# Patient Record
Sex: Female | Born: 1965 | Race: Black or African American | Hispanic: No | Marital: Married | Smoking: Never smoker
Health system: Southern US, Community
[De-identification: ages and names within clinical notes are randomized; demographics above are authoritative.]

## PROBLEM LIST (undated history)

## (undated) DIAGNOSIS — R609 Edema, unspecified: Secondary | ICD-10-CM

## (undated) DIAGNOSIS — I10 Essential (primary) hypertension: Secondary | ICD-10-CM

## (undated) DIAGNOSIS — G4730 Sleep apnea, unspecified: Secondary | ICD-10-CM

## (undated) DIAGNOSIS — S82831A Other fracture of upper and lower end of right fibula, initial encounter for closed fracture: Secondary | ICD-10-CM

## (undated) HISTORY — PX: DILATION AND CURETTAGE OF UTERUS: SHX78

---

## 2000-07-30 ENCOUNTER — Emergency Department (HOSPITAL_COMMUNITY): Admission: EM | Admit: 2000-07-30 | Discharge: 2000-07-30 | Payer: Self-pay | Admitting: Emergency Medicine

## 2001-06-28 ENCOUNTER — Ambulatory Visit (HOSPITAL_COMMUNITY): Admission: RE | Admit: 2001-06-28 | Discharge: 2001-06-28 | Payer: Self-pay | Admitting: Family Medicine

## 2001-06-28 ENCOUNTER — Encounter: Payer: Self-pay | Admitting: Family Medicine

## 2002-03-22 ENCOUNTER — Inpatient Hospital Stay (HOSPITAL_COMMUNITY): Admission: RE | Admit: 2002-03-22 | Discharge: 2002-03-24 | Payer: Self-pay | Admitting: Obstetrics and Gynecology

## 2002-03-22 ENCOUNTER — Encounter (INDEPENDENT_AMBULATORY_CARE_PROVIDER_SITE_OTHER): Payer: Self-pay | Admitting: Specialist

## 2002-08-11 HISTORY — PX: ABDOMINAL HYSTERECTOMY: SHX81

## 2002-09-22 ENCOUNTER — Encounter: Payer: Self-pay | Admitting: Obstetrics and Gynecology

## 2002-09-22 ENCOUNTER — Ambulatory Visit (HOSPITAL_COMMUNITY): Admission: RE | Admit: 2002-09-22 | Discharge: 2002-09-22 | Payer: Self-pay | Admitting: Obstetrics and Gynecology

## 2002-11-07 ENCOUNTER — Encounter (INDEPENDENT_AMBULATORY_CARE_PROVIDER_SITE_OTHER): Payer: Self-pay

## 2002-11-07 ENCOUNTER — Inpatient Hospital Stay (HOSPITAL_COMMUNITY): Admission: RE | Admit: 2002-11-07 | Discharge: 2002-11-09 | Payer: Self-pay | Admitting: Obstetrics and Gynecology

## 2003-02-15 ENCOUNTER — Encounter: Payer: Self-pay | Admitting: Internal Medicine

## 2003-02-15 ENCOUNTER — Encounter: Admission: RE | Admit: 2003-02-15 | Discharge: 2003-02-15 | Payer: Self-pay | Admitting: Internal Medicine

## 2003-10-06 ENCOUNTER — Ambulatory Visit (HOSPITAL_COMMUNITY): Admission: RE | Admit: 2003-10-06 | Discharge: 2003-10-06 | Payer: Self-pay | Admitting: Family Medicine

## 2004-02-16 ENCOUNTER — Encounter: Admission: RE | Admit: 2004-02-16 | Discharge: 2004-02-16 | Payer: Self-pay | Admitting: Internal Medicine

## 2004-08-11 DIAGNOSIS — G4730 Sleep apnea, unspecified: Secondary | ICD-10-CM

## 2004-08-11 HISTORY — DX: Sleep apnea, unspecified: G47.30

## 2004-10-27 ENCOUNTER — Emergency Department (HOSPITAL_COMMUNITY): Admission: EM | Admit: 2004-10-27 | Discharge: 2004-10-27 | Payer: Self-pay | Admitting: Emergency Medicine

## 2004-10-28 ENCOUNTER — Encounter: Admission: RE | Admit: 2004-10-28 | Discharge: 2004-10-28 | Payer: Self-pay | Admitting: Internal Medicine

## 2004-11-04 ENCOUNTER — Emergency Department (HOSPITAL_COMMUNITY): Admission: EM | Admit: 2004-11-04 | Discharge: 2004-11-04 | Payer: Self-pay | Admitting: Emergency Medicine

## 2005-06-21 ENCOUNTER — Ambulatory Visit (HOSPITAL_BASED_OUTPATIENT_CLINIC_OR_DEPARTMENT_OTHER): Admission: RE | Admit: 2005-06-21 | Discharge: 2005-06-21 | Payer: Self-pay | Admitting: Family Medicine

## 2005-06-29 ENCOUNTER — Ambulatory Visit: Payer: Self-pay | Admitting: Internal Medicine

## 2005-10-23 ENCOUNTER — Ambulatory Visit: Payer: Self-pay | Admitting: Gastroenterology

## 2005-10-24 ENCOUNTER — Emergency Department (HOSPITAL_COMMUNITY): Admission: EM | Admit: 2005-10-24 | Discharge: 2005-10-24 | Payer: Self-pay | Admitting: Emergency Medicine

## 2005-11-12 ENCOUNTER — Ambulatory Visit: Payer: Self-pay | Admitting: Gastroenterology

## 2005-11-12 ENCOUNTER — Encounter (INDEPENDENT_AMBULATORY_CARE_PROVIDER_SITE_OTHER): Payer: Self-pay | Admitting: *Deleted

## 2006-04-01 ENCOUNTER — Encounter: Admission: RE | Admit: 2006-04-01 | Discharge: 2006-06-30 | Payer: Self-pay | Admitting: Chiropractic Medicine

## 2006-04-01 ENCOUNTER — Encounter: Admission: RE | Admit: 2006-04-01 | Discharge: 2006-04-01 | Payer: Self-pay | Admitting: Internal Medicine

## 2006-08-11 HISTORY — PX: DIAGNOSTIC LAPAROSCOPY: SUR761

## 2007-01-22 ENCOUNTER — Encounter (INDEPENDENT_AMBULATORY_CARE_PROVIDER_SITE_OTHER): Payer: Self-pay | Admitting: Obstetrics and Gynecology

## 2007-01-22 ENCOUNTER — Ambulatory Visit (HOSPITAL_COMMUNITY): Admission: RE | Admit: 2007-01-22 | Discharge: 2007-01-22 | Payer: Self-pay | Admitting: Obstetrics and Gynecology

## 2007-04-21 ENCOUNTER — Encounter: Admission: RE | Admit: 2007-04-21 | Discharge: 2007-04-21 | Payer: Self-pay | Admitting: Internal Medicine

## 2007-09-26 ENCOUNTER — Emergency Department (HOSPITAL_COMMUNITY): Admission: EM | Admit: 2007-09-26 | Discharge: 2007-09-26 | Payer: Self-pay | Admitting: Emergency Medicine

## 2009-04-01 ENCOUNTER — Emergency Department (HOSPITAL_COMMUNITY): Admission: EM | Admit: 2009-04-01 | Discharge: 2009-04-02 | Payer: Self-pay | Admitting: Emergency Medicine

## 2010-03-06 ENCOUNTER — Encounter: Admission: RE | Admit: 2010-03-06 | Discharge: 2010-03-06 | Payer: Self-pay | Admitting: Internal Medicine

## 2010-03-21 ENCOUNTER — Encounter: Admission: RE | Admit: 2010-03-21 | Discharge: 2010-03-21 | Payer: Self-pay | Admitting: Internal Medicine

## 2010-09-01 ENCOUNTER — Encounter: Payer: Self-pay | Admitting: Internal Medicine

## 2010-11-16 LAB — GLUCOSE, CAPILLARY: Glucose-Capillary: 85 mg/dL (ref 70–99)

## 2010-12-24 NOTE — Op Note (Signed)
Cynthia Hendricks, Cynthia Hendricks                ACCOUNT NO.:  [id number]   MEDICAL RECORD NO.:  [id number]          PATIENT TYPE:  AMB   LOCATION:  SDC                           FACILITY:  [facility]   PHYSICIAN:  Zenaida Niece, M.D.DATE OF BIRTH:  July 24, 1966   DATE OF PROCEDURE:  01/22/2007  DATE OF DISCHARGE:                               OPERATIVE REPORT   PREOPERATIVE DIAGNOSIS:  Pelvic pain.   POSTOPERATIVE DIAGNOSIS:  Pelvic pain and pelvic adhesions.   PROCEDURE:  Open laparoscopy with bilateral oophorectomy and  adhesiolysis.   SURGEON:  Zenaida Niece, M.D.   ANESTHESIA:  General endotracheal tube.   SPECIMENS:  Bilateral ovaries to pathology.   ESTIMATED BLOOD LOSS:  Minimal.   COMPLICATIONS:  None.   FINDINGS:  She had a normal upper abdomen. She had omentum adherent to  the anterior abdominal wall in the midline. Both ovaries were stuck to  the pelvic side walls and the vaginal cuff with fairly dense adhesions.  Neither fallopian tube was definitely identified.   PROCEDURE IN DETAIL:  The patient was taken to the operating room and  placed in the dorsal supine position. General anesthesia was induced,  and she was placed in mobile stirrups. Infraumbilical skin was  infiltrated with 0.25% Marcaine, and a 3-cm horizontal incision was  made. The fascia was identified bluntly and elevated with Kocher clamps.  It was then entered sharply. The peritoneum was then identified and  entered bluntly. A pursestring suture of 0 Vicryl was placed around the  fascial incision and a Hasson cannula inserted. The abdomen was then  insufflated with CO2 gas. Upon entering with the laparoscope, omental  adhesions in the midline were found. A 5-mm port was placed on the left  side, and using harmonic scalpel, these omental adhesions were taken  down with good hemostasis and without complications. I was then able to  visualize the pelvis. Both ovaries were identified and were found to be  significantly adherent to the pelvic side walls and the vaginal cuff.  They appeared to be adherent to each other at the vaginal cuff. Another  5-mm port was placed low in the midline also under direct visualization.  This was used to grasp the ovary and elevate it. Using the harmonic  scalpel, I carefully freed up some of the adhesions of the right ovary  to make it a little bit more mobile. The right infundibulopelvic  ligament was then taken down with the harmonic scalpel, and this was  hemostatic. Staying laterally and careful to avoid the ureter which was  easily identified, I was able to gradually free the ovary from the side  wall with significant dissection using the harmonic scalpel. However, it  was difficult to tell where the ovary ended and the side wall and  vaginal cuff started. I was then able to free up some adhesions that  were covering the left ovary which was also stuck at the vaginal cuff.  The left ovary was grasped and elevated, and again using harmonic  scalpel, I was able to take down some adhesions to expose the  ovary and  infundibulopelvic ligament. The infundibulopelvic ligament was taken  down with the harmonic scalpel, and then staying lateral, I was able to  free up the left ovary from the pelvic side wall. I was eventually able  to free up where both of the ovaries were just attached to the vaginal  cuff, and this was then taken down with the harmonic scalpel to free  both ovaries which were adherent to each other. These were held onto,  and the pelvis was copiously irrigated and a small amount of bleeding  controlled with the harmonic scalpel. I was never able to definitively  identify fallopian tubes. The left ureter was not able to be identified  due to adhesions, but all suture lines appeared fairly superficial. I  was still able to visualize the right ureter, and it was still  peristalsing. A 5-mm scope was then inserted, and an Endobag was placed   through the umbilical trocar. The ovaries were placed in the bag, and  these were brought to the umbilical trocar. The pelvis was again  inspected and irrigated and found to be hemostatic. The 5-mm trocar low  in the midline was removed and found to be hemostatic. The lateral 5-mm  port was also removed. The ovaries were then easily removed in the bag  through the umbilical incision. All gas was allowed to deflate from the  abdomen. The previously placed pursestring suture was tied. There was  still a small fascial defect in the midline, and this was repaired with  a figure-of-eight suture of 0 Vicryl. Skin incisions were then closed  with interrupted subcuticular sutures of 4-0 Vicryl followed by  Dermabond. A sponge stick which had been placed in the vagina for  visualization of the vaginal cuff was removed. The patient tolerated the  procedure well. She was extubated in the operating room and taken to the  recovery room in stable condition. Counts were correct, and she had PAS  hose on throughout the procedure.      Zenaida Niece, M.D.  Electronically Signed     TDM/MEDQ  D:  01/22/2007  T:  01/22/2007  Job:  [id number]

## 2010-12-27 NOTE — Discharge Summary (Signed)
   NAME:  Cynthia Hendricks, Cynthia Hendricks                          ACCOUNT NO.:  [id number]   MEDICAL RECORD NO.:  [id number]                   PATIENT TYPE:  INP   LOCATION:  0460                                 FACILITY:  [facility]   PHYSICIAN:  Zenaida Niece, M.D.             DATE OF BIRTH:  September 12, 1965   DATE OF ADMISSION:  11/07/2002  DATE OF DISCHARGE:  11/09/2002                                 DISCHARGE SUMMARY   ADMISSION DIAGNOSIS:  Symptomatic leiomyomatous uterus.   DISCHARGE DIAGNOSES:  Symptomatic leiomyomatous uterus and left ovarian  cyst.   PROCEDURES:  Total abdominal hysterectomy and left ovarian cystectomy.   HISTORY AND PHYSICAL:  Please see the chart for the full history and  physical. Briefly, this is a 45 year old black female, gravida 2, para 1-0-1-  1 who underwent an abdominal myomectomy in August of 2003.  She initially  went well but now has had persistent abnormal bleeding despite oral  contraceptives and wishes to proceed with definitive surgical therapy.  Ultrasound does reveal two small fibroids. Physical exam significant for a  benign abdomen with a well healed transverse scar. On pelvic exam, she  appears to have a normal size mid planar nontender uterus without adnexal  masses.   HOSPITAL COURSE:  The patient was admitted on the day of surgery and  underwent a TAH and left ovarian cystectomy. There were adhesions of the  ovaries to the uterus and sidewalls and there was a 6 cm simple left ovarian  cyst. Blood loss was 200 mL. Postoperatively she did very well, remained  afebrile and was rapidly able to ambulate and tolerate a diet. She did  experience some nausea on postoperative day #1 but was able to tolerate a  regular diet on the evening of postoperative day one. Preoperative  hemoglobin 12.4, postoperative 11.4. On the morning of postoperative day  two, she was felt to be stable enough for discharge home.   DISCHARGE INSTRUCTIONS:  Regular diet, pelvic  rest, no strenuous activity.  Followup is in two days for staple removal.   DISCHARGE MEDICATIONS:  Percocet #30, 1-2 p.o. q. 4-6h p.r.n. pain.   Final pathology is pending.                                               Zenaida Niece, M.D.    TDM/MEDQ  D:  11/09/2002  T:  11/09/2002  Job:  [id number]

## 2010-12-27 NOTE — Op Note (Signed)
NAME:  Cynthia Hendricks, Cynthia Hendricks                          ACCOUNT NO.:  [id number]   MEDICAL RECORD NO.:  [id number]                   PATIENT TYPE:  INP   LOCATION:  G956                                 FACILITY:  [facility]   PHYSICIAN:  Zenaida Niece, M.D.             DATE OF BIRTH:  1966/08/02   DATE OF PROCEDURE:  11/07/2002  DATE OF DISCHARGE:                                 OPERATIVE REPORT   PREOPERATIVE DIAGNOSIS:  Symptomatic leiomyomatous uterus.   POSTOPERATIVE DIAGNOSES:  1. Symptomatic leiomyomatous uterus.  2. Simple left ovarian cyst.   PROCEDURE:  Total abdominal hysterectomy with left ovarian cystectomy.   SURGEON:  Zenaida Niece, M.D.   ASSISTANT:  Malachi Pro. Ambrose Mantle, M.D.   ANESTHESIA:  General endotracheal tube.   ESTIMATED BLOOD LOSS:  200 mL.   FINDINGS:  Uterus was slightly enlarged, and there were adhesions in the  anterior cul-de-sac and both ovaries.  There was a 6 cm simple left ovarian  cyst.  Appendix was normal.   PROCEDURE IN DETAIL:  The patient was taken to the operating room and placed  in the dorsal supine position.  General anesthesia was induced, and her  abdomen was prepped and draped in the usual sterile fashion.  Foley catheter  was also inserted, and the vagina was prepped with Betadine.  Her abdomen  was then entered via her previous Pfannenstiel incision.  There was a fair  amount of scar tissue at the level of the fascia, making the fascia  difficult to identify.  Peritoneal cavity was entered bluntly and the  incision extended superiorly and inferiorly with good visualization of the  bowl and bladder.  A self-retaining retractor was then placed, and the  bowels were packed out of the pelvis.  Adhesions of the anterior cul-de-sac  to the anterior portion of the uterus were taken down bluntly, and the  uterine cornua were grasped with Kelly clamps.  Both round ligaments were  divided with electrocautery, and a window was made in the  posterior leaf of  the broad ligament.  This was done after both ovaries were mobilized from  the neck of the uterus and the sidewalls to free up filmy adhesions.  The  uteroovarian pedicles on each side were clamped with Zepplin clamps, and  these pedicles were transected and doubly ligated with #1 chromic.  The  bladder flap was created across the anterior portion of the uterus and the  bladder pushed inferiorly.  The uterine arteries were skeletonized, clamped,  transected, and ligated with #1 chromic.  Cardinal ligaments and uterosacral  ligaments were clamped, transected, and ligated on each side with #1  chromic.  On the patient's left side, this also included the left vaginal  angle.  Right vaginal angle was then clamped, transected, and ligated with  #1 chromic, and the remainder of the cervix was removed from the vagina.  The vaginal cuff  was then closed with interrupted figure-of-eight sutures of  #1 chromic with adequate hemostasis.  Uterosacral ligaments were plicated in  the midline with one interrupted suture of #1 chromic.  Both ureters had  previously been identified and found to be out of the operative field.  All  pedicles were inspected and the bladder flap also inspected and found to be  hemostatic.  There was then noted to be some bleeding from the right  uteroovarian pedicle, and this was sutured with 3-0 and 0 Vicryl to obtain  hemostasis.  Attention was then turned to the left ovary.  A window was made  over the simple left ovarian cyst, and the cyst was drained.  The cyst wall  was then removed with sharp and blunt dissection from the remainder of the  ovarian tissue and sent as a specimen.  The redundant ovarian tissue was  resected with electrocautery and the remainder of the ovary made hemostatic.  The ovarian capsule was not closed as it was hemostatic.  The pelvis was  again irrigated and found to be hemostatic.  All packs were removed from the  abdomen.  The  subfascial space was irrigated and then made hemostatic with  electrocautery.  The rectus muscle was closed in the midline with one figure-  of-eight suture of #1 Vicryl just to keep the bowels from prolapsing through  this incision.  Fascia was then closed in a running fashion, starting at  both ends and meeting in the middle with #1 PDS.  Again, fascial layers were  slightly difficult to identify from her previous surgery.  Subcutaneous  tissue was irrigated and made hemostatic with electrocautery, and the skin  was closed with staples and a sterile dressing.  The patient tolerated the  procedure well, was extubated in the operating room and taken to the  recovery room in stable condition.  Counts were correct x 2, and she was  given Ancef 1 g prior to the procedure.                                               Zenaida Niece, M.D.    TDM/MEDQ  D:  11/07/2002  T:  11/07/2002  Job:  [id number]

## 2010-12-27 NOTE — Discharge Summary (Signed)
Cynthia, Hendricks                          ACCOUNT NO.:  [id number]   MEDICAL RECORD NO.:  [id number]                   PATIENT TYPE:  INP   LOCATION:  0465                                 FACILITY:  [facility]   PHYSICIAN:  Zenaida Niece, M.D.             DATE OF BIRTH:  1966-04-07   DATE OF ADMISSION:  03/22/2002  DATE OF DISCHARGE:  03/24/2002                                 DISCHARGE SUMMARY   ADMISSION DIAGNOSIS:  Symptomatic leiomyomatous uterus.   DISCHARGE DIAGNOSIS:  Symptomatic leiomyomatous uterus.   PROCEDURE:  Abdominal myomectomy.   COMPLICATIONS:  None.   CONSULTATIONS:  None.   HISTORY OF PRESENT ILLNESS:  Please see the chart for the full history and  physical, but briefly, this is a 45 year old black female, gravida 2, para 1-  0-1-1, with dyspareunia, irregular bleeding, some difficulty urinating due  to pressure on her bladder.  These symptoms are all due to an enlarged  leiomyomatous uterus.  Options have been discussed with the patient and she  wishes to proceed with myomectomy.  Again, her past history is in the chart.   PHYSICAL EXAMINATION:  GENERAL:  Significant for a weight of 269 pounds.  ABDOMEN:  Soft, nondistended, slightly tender on the right lower quadrant  with a palpable fibroid at that point.  PELVIC:  An anteverted irregular uterus approximately 12 weeks in size with  no significant adnexal masses and palpable fibroids.   HOSPITAL COURSE:  The patient was admitted on 03/22/02, and underwent an  abdominal myomectomy under general anesthesia.  The uterus was 10 to 12  weeks size with two large anterior myomas and several smaller posterior  myomas.  She had normal tubes and ovaries with a 2 to 3 cm simple left  ovarian cyst.  Postoperatively, she did very well.  She remained afebrile  and was fairly rapidly able to ambulate and tolerate a regular diet.  Preoperative hemoglobin was 13.7, postoperative was 11.2.  On the night of  postoperative day #1, she did have a low-grade temperature to 100.5, but  other than that remained afebrile.  On the morning of postoperative day #2,  her incision was healing well.  Later that day she had gone without any  further significant fevers, and was felt to be stable enough for discharge  home.   DIET:  Regular.   ACTIVITY:  Pelvic rest, no strenuous activity.    FOLLOWUP:  In three to four days for staple removal.   DISCHARGE MEDICATIONS:  Percocet p.r.n. pain.                                                Zenaida Niece, M.D.    TDM/MEDQ  D:  04/09/2002  T:  04/09/2002  Job:  [id number]

## 2010-12-27 NOTE — Procedures (Signed)
Cynthia Hendricks, Cynthia Hendricks                ACCOUNT NO.:  [id number]   MEDICAL RECORD NO.:  [id number]          PATIENT TYPE:  OUT   LOCATION:  SLEEP CENTER                 FACILITY:  [facility]   PHYSICIAN:  Clinton D. Maple Hudson, M.D. DATE OF BIRTH:  01-09-1966   DATE OF STUDY:  06/21/2005                              NOCTURNAL POLYSOMNOGRAM   REFERRING PHYSICIAN:  Dr. Ralene Ok.   DATE OF STUDY:  June 21, 2005.   INDICATION FOR STUDY:  Hypersomnia with sleep apnea.   EPWORTH SLEEPINESS SCORE:  17/24.   BMI:  34.8.   WEIGHT:  225 pounds.   SLEEP ARCHITECTURE:  Total sleep time 404 minutes with sleep efficiency 92%.  Stage I 12%, stage II 56%, stages III and IV 2%, REM 29% of total sleep  time. Sleep latency 11 minutes, REM latency 60 minutes, awake after sleep  onset 25 minutes. Arousal index modestly increased at 26. There were  frequent brief awakenings and sleep stage changes in the first half of the  night. No bedtime medication taken.   RESPIRATORY DATA:  Apnea/hypopnea index (AHI, RDI) 19.3 obstructive events  per hour indicating moderate obstructive sleep apnea/hypopnea syndrome. This  included 75 obstructive apneas, 1 mixed apnea and 54 hypopneas. Most initial  sleep and most events were while supine although significant events were  also reported while sleeping on her right side later in the night. REM AHI  45.4. She did not have enough early events to permit use of C-PAP titration  by split protocol on the study night.   OXYGEN DATA:  Moderate snoring with oxygen desaturation to a nadir of 86%.  Mean oxygen saturation through the study was 97% on room air.   CARDIAC DATA:  Normal sinus rhythm with occasional PVCs and PJC.   MOVEMENT/PARASOMNIA:  Leg jerks noticed mostly during arousals, nonspecific.   IMPRESSION/RECOMMENDATIONS:  1.  Moderate obstructive sleep apnea/hypopnea syndrome, AHI 19.3 per hour,      increased while supine and in REM.  2.  Moderate snoring with  oxygen desaturation to 86%. Normal mean oxygen      saturation across the study night.  3.  Consider return for C-PAP titration or evaluate for alternative      therapies as appropriate.      Clinton D. Maple Hudson, M.D.  Diplomate, Biomedical engineer of Sleep Medicine  Electronically Signed     CDY/MEDQ  D:  06/29/2005 09:36:08  T:  06/29/2005 22:29:39  Job:  [id number]

## 2010-12-27 NOTE — H&P (Signed)
NAME:  Cynthia Hendricks, Cynthia Hendricks                          ACCOUNT NO.:  [id number]   MEDICAL RECORD NO.:  [id number]                   PATIENT TYPE:  INP   LOCATION:  NA                                   [facility]ACILITY[facility]   PHYSICIAN:  Zenaida Niece, M.D.             DATE OF BIRTH:  05/04/1966   DATE OF ADMISSION:  11/07/2002  DATE OF DISCHARGE:                                HISTORY & PHYSICAL   CHIEF COMPLAINT:  Symptomatic leiomyomatous uterus.   HISTORY OF PRESENT ILLNESS:  This is a 45 year old black female, gravida 2,  para 1-0-1-1 who had an abdominal myomectomy in August of 2003. At that  time, the uterus was 10-12 week size with two large anterior myomas and  several small posterior myomas. These were all removed and at the end of the  procedure there were no palpable fibroids. The patient initially did well  after surgery and was placed on oral contraceptives for birth control. She  then had some heavy bleeding with the birth control pills and some irregular  bleeding. Pelvic ultrasound performed February 12 revealed at least two  measurable fibroids measuring 1.2 x 1.3 cm and 2. 3 x 2.3 cm. These fibroids  were not felt to be submucosal. The patient was counselled as to options and  initially wished to undergo endometrial oblation. However on further  thought, she wishes to proceed with definitive surgical therapy and is  admitted for hysterectomy at this time.   PAST OB HISTORY:  1992 vaginal delivery at term without complications, baby  weighed 8 pounds. She has had one elective termination.   PAST MEDICAL HISTORY:  Gastroesophageal reflux, sinusitis, heel spurs.   PAST SURGICAL HISTORY:  Double myomectomy.   ALLERGIES:  None known.   CURRENT MEDICATIONS:  None.   FAMILY HISTORY:  No GYN or colon cancer.   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   PHYSICAL EXAMINATION:   VITAL SIGNS:  Weight is approximately 250 pounds.  GENERAL:  She is a slightly obese black female in no acute distress.  NECK:  Supple without lymphadenopathy or thyromegaly.  LUNGS:  Clear to auscultation.  HEART:  Regular rate and rhythm without murmur.  ABDOMEN:  Soft, nontender, nondistended without any palpable masses and does  have a well healed transverse scar.  EXTREMITIES:  No edema, are nontender and DTRs are 2/4 and symmetric.  PELVIC:  Reveals normal external genitalia. On speculum exam, she has a  normal cervix. On bimanual exam, she has a fairly small mid planar nontender  uterus and no adnexal masses.   ASSESSMENT:  Symptomatic leiomyomatous uterus. This is approximately 7  months status post abdominal myomectomy. An extensive discussion has been  had with the patient regarding all possible options.  This includes all  medical and surgical  options. The patient wishes now to proceed with  definitive surgical therapy. Permanent sterility has been discussed with the  patient. The risks of surgery including bleeding, infection and damage to  surrounding organs have also been discussed. Preop testing has revealed a  UTI with E. coli and the patient is currently on Septra.   PLAN:  Admit the patient on March 29 for an abdominal hysterectomy with  possible BSO. We will retain her ovaries unless they are abnormal or  encounter significant bleeding.                                               Zenaida Niece, M.D.    TDM/MEDQ  D:  11/04/2002  T:  11/04/2002  Job:  [id number]

## 2010-12-27 NOTE — H&P (Signed)
NAME:  Cynthia Hendricks, Cynthia Hendricks                          ACCOUNT NO.:  [id number]   MEDICAL RECORD NO.:  [id number]                   PATIENT TYPE:  INP   LOCATION:  NA                                   FACILITY:  [facility]   PHYSICIAN:  Zenaida Niece, M.D.             DATE OF BIRTH:  05-11-1966   DATE OF ADMISSION:  03/22/2002  DATE OF DISCHARGE:                                HISTORY & PHYSICAL   CHIEF COMPLAINT:  Symptomatic leiomyomatous uterus.   HISTORY OF PRESENT ILLNESS:  The patient is a 45 year-old black female,  gravida 2, para 1-0-1-1 who was seen on July 17.  At that time she said her  last normal menses was in April 2003 and since then she has had light  bleeding one to two times a month without cramping.  She is occasionally  sexually active with deep dyspareunia and does have some difficulty  urinating due to pressure on her bladder. Exam was consistent with an  enlarged fibroid uterus.  Pelvic ultrasound from November 2002 reveals at  least two large fundal myomas measuring almost 5 cm and almost 4 cm.  An  endometrial biopsy done in the office reveals focal simple hyperplasia  without atypia.  The patient wishes to preserve her fertility.  All options  had been discussed with the patient and she wishes to proceed with surgical  myomectomy.   OB HISTORY:  1. 1992, vaginal delivery at term without complications.  The baby weighed 8     pounds.  2. One elective termination.   PAST MEDICAL HISTORY:  1. Recent gastroesophageal reflux.  2. Recent sinusitis.  3. Heel spurs.   PAST SURGICAL HISTORY:  None.   ALLERGIES:  None known.   MEDICATIONS:  1. Codeine for heel spurs.  2. Previously on Nordette oral contraceptives.  3. She is just finishing a course of Augmentin for sinusitis.  4. Prevacid for reflux.   FAMILY HISTORY:  No GYN or colon cancer.   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  She denies  alcohol, tobacco or drug use.   PHYSICAL EXAMINATION:  VITAL SIGNS:  Weight is 269 pounds, pulse 70, blood  pressure 120/70.  GENERAL:  She is a slightly obese black female in no acute distress.  HEENT:  Neck is supple without lymphadenopathy or thyromegaly.  LUNGS:  Clear to auscultation.  CARDIAC:  Heart - regular rate and rhythm without murmur.  ABDOMEN:  Soft and non-distended, slightly tender in the right lower  quadrant with a palpable fibroid in the right lower quadrant.  PELVIC:  External genitalia reveals condylomata at 3:00.  On speculum exam  she has a normal cervix and the Pipelle sounded to 9 cm and did deviate to  the patient's right side.  Bimanual exam reveals an anteverted, irregular  uterus, approximately 12  weeks size which is deviated mostly to the  patient's right and is slightly tender with palpable fibroids.   OFFICE LABORATORIES:  Hemoglobin 12.8.  Negative urine dipstick.   ASSESSMENT:  Symptomatic leiomyomatous uterus.  Options have been discussed  with the patient including medical treatment with Lupron, uterine artery  embolization and surgical therapy with myomectomy or hysterectomy.  As the  patient does wish to retain the possibility of having children, I have  discouraged the use of uterine artery embolization and obviously  hysterectomy.  Weighing these options, she has elected to proceed with  myomectomy.  The risks of surgery including bleeding, infection and damage  to bowels and bladder as well as risks of transfusion and hysterectomy have  been discussed with the patient.   PLAN:  Admit the patient on the day for surgery for an abdominal myomectomy.                                               Zenaida Niece, M.D.    TDM/MEDQ  D:  03/15/2002  T:  03/15/2002  Job:  [id number]

## 2010-12-27 NOTE — Op Note (Signed)
TNAMENEALA, MIGGINS                         ACCOUNT NO.:  [id number]   MEDICAL RECORD NO.:  [id number]                   PATIENT TYPE:  INP   LOCATION:  NA                                   FACILITY:  [facility]   PHYSICIAN:  Zenaida Niece, M.D.             DATE OF BIRTH:  02/10/1966   DATE OF PROCEDURE:  03/22/2002  DATE OF DISCHARGE:                                 OPERATIVE REPORT   PREOPERATIVE DIAGNOSIS:  Symptomatic leiomyomatous uterus.   POSTOPERATIVE DIAGNOSIS:  Symptomatic leiomyomatous uterus.   PROCEDURE:  Abdomen myomectomy.   SURGEON:  Lavina Hamman, M.D.   ASSISTANT:  Tracey Harries, M.D.   ANESTHESIA:  General endotracheal tube.   ESTIMATED BLOOD LOSS:  100 cc.   FINDINGS:  The uterus was enlarged to 10-12 week size with two large  anterior myomas and several small posterior myomas. The tubes and ovaries  appeared normal except for a 2-3 cm simple left ovarian cyst.   DESCRIPTION OF PROCEDURE:  The patient was taken to the operating room and  placed in the dorsal supine position. General anesthesia was induced and her  abdomen was prepped and draped in the usual sterile fashion and a Foley  catheter inserted. Her abdomen was then entered via a standard Pfannenstiel  incision. A self retaining retractor was placed and bowels packed out of the  pelvis. The uterus elevated to the incision and there was noted to be a  large fundal myoma and a large anterior fundal myoma. There were also noted  to be several 1 or subcentimeter posterior myomas. The uterine serosa over  the two anterior fibroids was infiltrated with a dilute solution of  Pitressin. An incision was made in the uterus with electrocautery from the  fundus anteriorly to below the level of the  most inferior myoma. Both  myomas were then shelled out using sharp and blunt dissection as well as  electrocautery. The most fundal myoma was approximately 5-6 cm and the one  inferior to this was 3-4 cm.  There was another small 1 cm myoma between  these two myomas and a smaller myoma in the incision superiorly. The defect  did not appear to enter the endometrial cavity but was a sizeable defect.  Attention was then turned posteriorly. Each of these small fibroids was  grasped with towel clip and removed with electrocautery. These left only  serosal or minimal myometrial defects posteriorly. On palpation, there were  two further myomas a little bit deeper posteriorly. The myometrium over  these was infiltrated with a dilute solution of Pitressin and an incision  made with electrocautery. The myomas were grasped with towel clips  dissecting with sharp dissection and electrocautery. On further palpation,  there were no palpable myomas. The posterior defects that entered the  myometrium were closed with #0 Vicryl either in a locking fashion or with  figure-of-eights. Small serosal defects were closed with  3-0 Vicryl. The  large anterior defect was closed in two layers. The first two layers of  myometrium were with running #0 Vicryl. The third layer which closed a small  amount of myometrium and serosa was a baseball stitch with 3-0 Vicryl. This  achieved adequate hemostasis of all incisions. The pelvis was copiously  irrigated and found to be hemostatic. Tubes and ovaries again appeared  normal. Intercede was placed over all incisions to help prevent adhesions.  All packs were removed from the abdomen. The self retaining retractor was  removed. The subfascial space was inspected and made hemostatic with  electrocautery. The fascia was closed in a running fashion starting at both  ends and meeting in the middle with #0 Vicryl. The subcutaneous tissue was  irrigated and made hemostatic with electrocautery. The subcutaneous tissue  was closed with running 2-0 plain gut suture. The skin was then closed with  staples and a sterile dressing. Counts were correct x2. The patient received  Ancef 1 gm  prior to the procedure. The patient tolerated the procedure well  and was extubated in the operating room and taken to the recovery room in  stable condition.                                                Zenaida Niece, M.D.    TDM/MEDQ  D:  03/22/2002  T:  03/24/2002  Job:  [id number]

## 2011-05-02 LAB — URINE CULTURE: Colony Count: >=100000

## 2011-05-02 LAB — URINE MICROSCOPIC-ADD ON

## 2011-05-02 LAB — URINALYSIS, ROUTINE W REFLEX MICROSCOPIC
Bilirubin Urine: NEGATIVE
Glucose, UA: NEGATIVE
Ketones, ur: NEGATIVE
Leukocytes, UA: NEGATIVE
Nitrite: POSITIVE — AB
Protein, ur: NEGATIVE
Specific Gravity, Urine: 1.029
Urobilinogen, UA: 1
pH: 6

## 2011-05-29 LAB — CBC: WBC: 7.9

## 2011-05-29 LAB — BASIC METABOLIC PANEL
BUN: 7
Calcium: 8.7
Creatinine, Ser: 0.81
GFR calc Af Amer: >60
Sodium: 135

## 2011-12-08 ENCOUNTER — Other Ambulatory Visit: Payer: Self-pay | Admitting: Internal Medicine

## 2011-12-08 DIAGNOSIS — Z1231 Encounter for screening mammogram for malignant neoplasm of breast: Secondary | ICD-10-CM

## 2011-12-15 ENCOUNTER — Ambulatory Visit
Admission: RE | Admit: 2011-12-15 | Discharge: 2011-12-15 | Disposition: A | Discharge status: Home / Self Care | Payer: BC Managed Care – PPO | Source: Ambulatory Visit | Attending: Internal Medicine | Admitting: Internal Medicine

## 2011-12-15 DIAGNOSIS — Z1231 Encounter for screening mammogram for malignant neoplasm of breast: Secondary | ICD-10-CM

## 2011-12-18 ENCOUNTER — Other Ambulatory Visit: Payer: Self-pay | Admitting: Internal Medicine

## 2011-12-18 DIAGNOSIS — R928 Other abnormal and inconclusive findings on diagnostic imaging of breast: Secondary | ICD-10-CM

## 2011-12-25 ENCOUNTER — Ambulatory Visit
Admission: RE | Admit: 2011-12-25 | Discharge: 2011-12-25 | Disposition: A | Discharge status: Home / Self Care | Payer: BC Managed Care – PPO | Source: Ambulatory Visit | Attending: Internal Medicine | Admitting: Internal Medicine

## 2011-12-25 DIAGNOSIS — R928 Other abnormal and inconclusive findings on diagnostic imaging of breast: Secondary | ICD-10-CM

## 2012-01-09 ENCOUNTER — Ambulatory Visit: Payer: Self-pay | Admitting: *Deleted

## 2012-02-27 ENCOUNTER — Emergency Department (HOSPITAL_COMMUNITY): Payer: BC Managed Care – PPO

## 2012-02-27 ENCOUNTER — Encounter (HOSPITAL_COMMUNITY): Payer: Self-pay | Admitting: *Deleted

## 2012-02-27 ENCOUNTER — Emergency Department (HOSPITAL_COMMUNITY)
Admission: EM | Admit: 2012-02-27 | Discharge: 2012-02-28 | Disposition: A | Discharge status: Home / Self Care | Payer: BC Managed Care – PPO | Attending: Emergency Medicine | Admitting: Emergency Medicine

## 2012-02-27 DIAGNOSIS — I10 Essential (primary) hypertension: Secondary | ICD-10-CM | POA: Insufficient documentation

## 2012-02-27 DIAGNOSIS — S8263XA Displaced fracture of lateral malleolus of unspecified fibula, initial encounter for closed fracture: Secondary | ICD-10-CM | POA: Insufficient documentation

## 2012-02-27 DIAGNOSIS — S82899A Other fracture of unspecified lower leg, initial encounter for closed fracture: Secondary | ICD-10-CM

## 2012-02-27 DIAGNOSIS — E119 Type 2 diabetes mellitus without complications: Secondary | ICD-10-CM | POA: Insufficient documentation

## 2012-02-27 DIAGNOSIS — W010XXA Fall on same level from slipping, tripping and stumbling without subsequent striking against object, initial encounter: Secondary | ICD-10-CM | POA: Insufficient documentation

## 2012-02-27 HISTORY — DX: Essential (primary) hypertension: I10

## 2012-02-27 HISTORY — DX: Edema, unspecified: R60.9

## 2012-02-27 MED ORDER — HYDROMORPHONE HCL PF 1 MG/ML IJ SOLN
1.0000 mg | Freq: Once | INTRAMUSCULAR | Status: AC
  Administered 2012-02-27: 1 mg via INTRAVENOUS
  Filled 2012-02-27: qty 1

## 2012-02-27 MED ORDER — ETOMIDATE 2 MG/ML IV SOLN
0.1500 mg/kg | Freq: Once | INTRAVENOUS | Status: AC
  Administered 2012-02-27: 14.56 mg via INTRAVENOUS
  Filled 2012-02-27: qty 10

## 2012-02-27 MED ORDER — ONDANSETRON HCL 4 MG/2ML IJ SOLN
4.0000 mg | Freq: Once | INTRAMUSCULAR | Status: AC
  Administered 2012-02-27: 4 mg via INTRAVENOUS
  Filled 2012-02-27: qty 2

## 2012-02-27 NOTE — ED Provider Notes (Addendum)
History     CSN: [id number]  Arrival date & time 02/27/12  2014   First MD Initiated Contact with Patient 02/27/12 2258      Chief Complaint  Patient presents with  . Ankle Injury    (Consider location/radiation/quality/duration/timing/severity/associated sxs/prior treatment) HPI 46 year old black female who slipped on supine needles about 7 PM area there is a deformity as well moderate to severe pain. Pain is worse with movement or palpation. She's not able to bear weight on that ankle. There are no motor or sensory deficits. She denies other injury. She was given IV Dilaudid prior to my evaluation with significant relief of the pain.  Past Medical History  Diagnosis Date  . Hypertension   . Diabetes mellitus   . Fluid retention     Past Surgical History  Procedure Date  . Abdominal hysterectomy     History reviewed. No pertinent family history.  History  Substance Use Topics  . Smoking status: Not on file  . Smokeless tobacco: Not on file  . Alcohol Use: No    OB History    Grav Para Term Preterm Abortions TAB SAB Ect Mult Living                  Review of Systems  All other systems reviewed and are negative.    Allergies  Review of patient's allergies indicates no known allergies.  Home Medications   Current Outpatient Rx  Name Route Sig Dispense Refill  . HYDROCHLOROTHIAZIDE 25 MG PO TABS Oral Take 25 mg by mouth daily.    Marland Kitchen METFORMIN HCL 500 MG PO TABS Oral Take 500 mg by mouth daily with breakfast.    . MULTI-VITAMIN/MINERALS PO TABS Oral Take 1 tablet by mouth daily.      BP 133/61  Pulse 92  Temp 98.6 F (37 C) (Oral)  Resp 15  Wt 214 lb (97.07 kg)  SpO2 100%  Physical Exam General: Well-developed, well-nourished female in no acute distress; appearance consistent with age of record HENT: normocephalic, atraumatic Eyes: pupils equal round and reactive to light; extraocular muscles intact Neck: supple; nontender Heart: regular rate and  rhythm Lungs: clear to auscultation bilaterally Abdomen: soft; nondistended Extremities:  right ankle deformity with tenderness; foot distally neurovascularly intact with intact tendon function  Neurologic: Awake, alert and oriented; motor function intact in all extremities and symmetric; no facial droop Skin: Warm and dry Psychiatric: Normal mood and affect    ED Course  Procedures (including critical care time)  Procedural sedation Performed by: Skylin Kennerson L Consent: Verbal and written consent obtained. Risks and benefits: risks, benefits and alternatives were discussed Required items: required blood products, implants, devices, and special equipment available Patient identity confirmed: arm band and provided demographic data Time out: Immediately prior to procedure a "time out" was called to verify the correct patient, procedure, equipment, support staff and site/side marked as required.  Sedation type: moderate (conscious) sedation NPO time confirmed and considedered  Sedatives: ETOMIDATE  Physician Time at Bedside: 10 minutes  Vitals: Vital signs were monitored during sedation. Cardiac Monitor, pulse oximeter Patient tolerance: Patient tolerated the procedure well with no immediate complications. Comments: Pt with uneventful recovered. Returned to pre-procedural sedation baseline  CLOSED REDUCTION AND SPLINTING Consent was obtained in writing and verbally as noted above. After adequate sedation was achieved the patient's right ankle was reduced by means of traction. A short leg splint with stirrup was applied by myself assisted by the orthopedic technician. The right foot was  neurovascularly intact following the procedure. There were no complications and the patient tolerated it well.   MDM   Nursing notes and vitals signs, including pulse oximetry, reviewed.  Summary of this visit's results, reviewed by myself:   Imaging Studies: Dg Ankle 2 Views Right  2012/03/02   *RADIOLOGY REPORT*  Clinical Data: Postreduction  RIGHT ANKLE - 2 VIEW  Comparison: 02/27/2012 at 2212 hours  Findings: Interval reduction of previous fracture dislocations of the right ankle.  Significant improvement in alignment and position.  Cast material has been placed which obscures bony detail.  IMPRESSION: Improved alignment and position of fracture dislocation of right ankle.  Original Report Authenticated By: Marlon Pel, M.D.   Dg Ankle Complete Right  02/27/2012  *RADIOLOGY REPORT*  Clinical Data: The patient fell.  Twisted ankle.  Lateral and medial ankle swelling and pain.  RIGHT ANKLE - COMPLETE 3+ VIEW  Comparison: None.  Findings: There is a comminuted fracture of the lateral malleolus, associated with dislocation of the ankle with widened mortise medially.  Interosseous membrane injury is suspected given the presence of medial fracture fragment. The posterior malleolus is intact.  A small plantar calcaneal spur present.  IMPRESSION: Comminuted fracture dislocation.  Original Report Authenticated By: Patterson Hammersmith, M.D.   12:57 AM Patient now awake alert, eating. Right foot still neurovascularly intact.         Hanley Seamen, MD 02-Mar-2012 4540  Hanley Seamen, MD 2012/03/02 9811

## 2012-02-28 MED ORDER — ONDANSETRON 8 MG PO TBDP: ORAL_TABLET | ORAL | Status: AC

## 2012-02-28 MED ORDER — HYDROMORPHONE HCL PF 1 MG/ML IJ SOLN
1.0000 mg | Freq: Once | INTRAMUSCULAR | Status: AC
  Administered 2012-02-28: 1 mg via INTRAVENOUS
  Filled 2012-02-28: qty 1

## 2012-02-28 MED ORDER — OXYCODONE-ACETAMINOPHEN 5-325 MG PO TABS: 1.0000 | ORAL_TABLET | Freq: Four times a day (QID) | ORAL | Status: DC | PRN

## 2012-02-28 NOTE — ED Notes (Signed)
ZOX:[id number]<VW> Expected date:<BR> Expected time:<BR> Means of arrival:<BR> Comments:<BR> CLOSED

## 2012-03-03 ENCOUNTER — Encounter (HOSPITAL_BASED_OUTPATIENT_CLINIC_OR_DEPARTMENT_OTHER): Payer: Self-pay | Admitting: *Deleted

## 2012-03-03 NOTE — Progress Notes (Signed)
To come in for bmet and possible new ekg Had a sleep study 2006 showed mod OSA-to use cpap-does not use cpap-state she was working 3 jobs and tired-did tell her she may need to stay post op to watch her breathing. Denies any cardiac or resp problems

## 2012-03-04 ENCOUNTER — Other Ambulatory Visit: Payer: Self-pay | Admitting: Orthopedic Surgery

## 2012-03-04 ENCOUNTER — Encounter (HOSPITAL_BASED_OUTPATIENT_CLINIC_OR_DEPARTMENT_OTHER)
Admission: RE | Admit: 2012-03-04 | Discharge: 2012-03-04 | Disposition: A | Discharge status: Home / Self Care | Payer: BC Managed Care – PPO | Source: Ambulatory Visit | Attending: Orthopedic Surgery | Admitting: Orthopedic Surgery

## 2012-03-04 LAB — BASIC METABOLIC PANEL
BUN: 17 mg/dL (ref 6–23)
CO2: 24 mEq/L (ref 19–32)
Chloride: 100 mEq/L (ref 96–112)
Creatinine, Ser: 0.77 mg/dL (ref 0.50–1.10)

## 2012-03-05 ENCOUNTER — Encounter (HOSPITAL_BASED_OUTPATIENT_CLINIC_OR_DEPARTMENT_OTHER)
Admission: RE | Disposition: A | Discharge status: Home / Self Care | Payer: Self-pay | Source: Ambulatory Visit | Attending: Orthopedic Surgery

## 2012-03-05 ENCOUNTER — Ambulatory Visit (HOSPITAL_BASED_OUTPATIENT_CLINIC_OR_DEPARTMENT_OTHER)
Admission: RE | Admit: 2012-03-05 | Discharge: 2012-03-05 | Disposition: A | Discharge status: Home / Self Care | Payer: BC Managed Care – PPO | Source: Ambulatory Visit | Attending: Orthopedic Surgery | Admitting: Orthopedic Surgery

## 2012-03-05 ENCOUNTER — Encounter (HOSPITAL_BASED_OUTPATIENT_CLINIC_OR_DEPARTMENT_OTHER): Payer: Self-pay | Admitting: Anesthesiology

## 2012-03-05 ENCOUNTER — Encounter (HOSPITAL_BASED_OUTPATIENT_CLINIC_OR_DEPARTMENT_OTHER): Payer: Self-pay

## 2012-03-05 ENCOUNTER — Ambulatory Visit (HOSPITAL_BASED_OUTPATIENT_CLINIC_OR_DEPARTMENT_OTHER): Payer: BC Managed Care – PPO | Admitting: Anesthesiology

## 2012-03-05 DIAGNOSIS — I10 Essential (primary) hypertension: Secondary | ICD-10-CM | POA: Insufficient documentation

## 2012-03-05 DIAGNOSIS — S82843A Displaced bimalleolar fracture of unspecified lower leg, initial encounter for closed fracture: Secondary | ICD-10-CM | POA: Insufficient documentation

## 2012-03-05 DIAGNOSIS — Z01812 Encounter for preprocedural laboratory examination: Secondary | ICD-10-CM | POA: Insufficient documentation

## 2012-03-05 DIAGNOSIS — G4730 Sleep apnea, unspecified: Secondary | ICD-10-CM | POA: Insufficient documentation

## 2012-03-05 DIAGNOSIS — W010XXA Fall on same level from slipping, tripping and stumbling without subsequent striking against object, initial encounter: Secondary | ICD-10-CM | POA: Insufficient documentation

## 2012-03-05 DIAGNOSIS — S82831A Other fracture of upper and lower end of right fibula, initial encounter for closed fracture: Secondary | ICD-10-CM | POA: Diagnosis present

## 2012-03-05 DIAGNOSIS — E119 Type 2 diabetes mellitus without complications: Secondary | ICD-10-CM | POA: Insufficient documentation

## 2012-03-05 HISTORY — DX: Sleep apnea, unspecified: G47.30

## 2012-03-05 HISTORY — DX: Other fracture of upper and lower end of right fibula, initial encounter for closed fracture: S82.831A

## 2012-03-05 HISTORY — PX: ORIF ANKLE FRACTURE: SHX5408

## 2012-03-05 LAB — GLUCOSE, CAPILLARY: Glucose-Capillary: 132 mg/dL — ABNORMAL HIGH (ref 70–99)

## 2012-03-05 SURGERY — OPEN REDUCTION INTERNAL FIXATION (ORIF) ANKLE FRACTURE
Anesthesia: General | Site: Ankle | Laterality: Right | Wound class: Clean

## 2012-03-05 MED ORDER — OXYCODONE-ACETAMINOPHEN 10-325 MG PO TABS: 1.0000 | ORAL_TABLET | Freq: Four times a day (QID) | ORAL | Status: AC | PRN

## 2012-03-05 MED ORDER — HYDROMORPHONE HCL PF 1 MG/ML IJ SOLN: 0.2500 mg | INTRAMUSCULAR | Status: DC | PRN

## 2012-03-05 MED ORDER — FENTANYL CITRATE 0.05 MG/ML IJ SOLN
50.0000 ug | INTRAMUSCULAR | Status: DC | PRN
  Administered 2012-03-05: 100 ug via INTRAVENOUS

## 2012-03-05 MED ORDER — MEPERIDINE HCL 25 MG/ML IJ SOLN: 6.2500 mg | INTRAMUSCULAR | Status: DC | PRN

## 2012-03-05 MED ORDER — LACTATED RINGERS IV SOLN
INTRAVENOUS | Status: DC
  Administered 2012-03-05: 20 mL/h via INTRAVENOUS
  Administered 2012-03-05: 07:00:00 via INTRAVENOUS

## 2012-03-05 MED ORDER — PROMETHAZINE HCL 25 MG/ML IJ SOLN
6.2500 mg | INTRAMUSCULAR | Status: DC | PRN
  Administered 2012-03-05: 6.25 mg via INTRAVENOUS

## 2012-03-05 MED ORDER — METHOCARBAMOL 500 MG PO TABS: 500.0000 mg | ORAL_TABLET | Freq: Four times a day (QID) | ORAL | Status: AC

## 2012-03-05 MED ORDER — LIDOCAINE HCL (CARDIAC) 20 MG/ML IV SOLN
INTRAVENOUS | Status: DC | PRN
  Administered 2012-03-05: 50 mg via INTRAVENOUS

## 2012-03-05 MED ORDER — PROPOFOL 10 MG/ML IV EMUL
INTRAVENOUS | Status: DC | PRN
  Administered 2012-03-05: 200 mg via INTRAVENOUS

## 2012-03-05 MED ORDER — ONDANSETRON HCL 4 MG/2ML IJ SOLN
INTRAMUSCULAR | Status: DC | PRN
  Administered 2012-03-05: 4 mg via INTRAVENOUS

## 2012-03-05 MED ORDER — FENTANYL CITRATE 0.05 MG/ML IJ SOLN
INTRAMUSCULAR | Status: DC | PRN
  Administered 2012-03-05 (×2): 25 ug via INTRAVENOUS

## 2012-03-05 MED ORDER — DEXAMETHASONE SODIUM PHOSPHATE 4 MG/ML IJ SOLN
INTRAMUSCULAR | Status: DC | PRN
  Administered 2012-03-05: 10 mg via INTRAVENOUS

## 2012-03-05 MED ORDER — BUPIVACAINE-EPINEPHRINE PF 0.5-1:200000 % IJ SOLN
INTRAMUSCULAR | Status: DC | PRN
  Administered 2012-03-05: 30 mL

## 2012-03-05 MED ORDER — MIDAZOLAM HCL 2 MG/2ML IJ SOLN
1.0000 mg | INTRAMUSCULAR | Status: DC | PRN
  Administered 2012-03-05: 1 mg via INTRAVENOUS

## 2012-03-05 MED ORDER — PROMETHAZINE HCL 25 MG PO TABS: 25.0000 mg | ORAL_TABLET | Freq: Four times a day (QID) | ORAL | Status: DC | PRN

## 2012-03-05 MED ORDER — CEFAZOLIN SODIUM-DEXTROSE 2-3 GM-% IV SOLR
2.0000 g | INTRAVENOUS | Status: AC
  Administered 2012-03-05: 2 g via INTRAVENOUS

## 2012-03-05 SURGICAL SUPPLY — 79 items
APL SKNCLS STERI-STRIP NONHPOA (GAUZE/BANDAGES/DRESSINGS) ×1
BANDAGE ELASTIC 4 VELCRO ST LF (GAUZE/BANDAGES/DRESSINGS) ×2 IMPLANT
BANDAGE ELASTIC 6 VELCRO ST LF (GAUZE/BANDAGES/DRESSINGS) ×2 IMPLANT
BANDAGE ESMARK 6X9 LF (GAUZE/BANDAGES/DRESSINGS) ×1 IMPLANT
BENZOIN TINCTURE PRP APPL 2/3 (GAUZE/BANDAGES/DRESSINGS) ×2 IMPLANT
BIT DRILL 2.5X125 (BIT) ×1 IMPLANT
BLADE SURG 15 STRL LF DISP TIS (BLADE) ×3 IMPLANT
BLADE SURG 15 STRL SS (BLADE) ×4
BNDG CMPR 9X6 STRL LF SNTH (GAUZE/BANDAGES/DRESSINGS) ×1
BNDG COHESIVE 4X5 TAN STRL (GAUZE/BANDAGES/DRESSINGS) ×1 IMPLANT
BNDG ESMARK 6X9 LF (GAUZE/BANDAGES/DRESSINGS) ×2
CANISTER SUCTION 1200CC (MISCELLANEOUS) ×2 IMPLANT
CLOTH BEACON ORANGE TIMEOUT ST (SAFETY) ×2 IMPLANT
COVER MAYO STAND STRL (DRAPES) ×2 IMPLANT
COVER TABLE BACK 60X90 (DRAPES) ×2 IMPLANT
CUFF TOURNIQUET SINGLE 34IN LL (TOURNIQUET CUFF) ×2 IMPLANT
DECANTER SPIKE VIAL GLASS SM (MISCELLANEOUS) IMPLANT
DRAPE EXTREMITY T 121X128X90 (DRAPE) ×2 IMPLANT
DRAPE INCISE IOBAN 66X45 STRL (DRAPES) ×2 IMPLANT
DRAPE MICROSCOPE URBAN (DRAPES) IMPLANT
DRAPE OEC MINIVIEW 54X84 (DRAPES) ×2 IMPLANT
DRAPE U 20/CS (DRAPES) ×2 IMPLANT
DRAPE U-SHAPE 47X51 STRL (DRAPES) ×2 IMPLANT
DRAPE UTILITY W/TAPE 26X15 (DRAPES) ×2 IMPLANT
DRILL BIT 3.5 ×1 IMPLANT
DRSG PAD ABDOMINAL 8X10 ST (GAUZE/BANDAGES/DRESSINGS) ×2 IMPLANT
DURAPREP 26ML APPLICATOR (WOUND CARE) ×2 IMPLANT
ELECT REM PT RETURN 9FT ADLT (ELECTROSURGICAL) ×2
ELECTRODE REM PT RTRN 9FT ADLT (ELECTROSURGICAL) ×1 IMPLANT
GLOVE BIO SURGEON STRL SZ 6.5 (GLOVE) ×2 IMPLANT
GLOVE BIO SURGEON STRL SZ8 (GLOVE) ×3 IMPLANT
GLOVE BIOGEL PI IND STRL 7.0 (GLOVE) IMPLANT
GLOVE BIOGEL PI IND STRL 8 (GLOVE) ×2 IMPLANT
GLOVE BIOGEL PI INDICATOR 7.0 (GLOVE) ×1
GLOVE BIOGEL PI INDICATOR 8 (GLOVE) ×2
GLOVE EXAM NITRILE PF MED BLUE (GLOVE) ×1 IMPLANT
GLOVE ORTHO TXT STRL SZ7.5 (GLOVE) ×2 IMPLANT
GOWN PREVENTION PLUS XLARGE (GOWN DISPOSABLE) ×3 IMPLANT
GOWN PREVENTION PLUS XXLARGE (GOWN DISPOSABLE) ×2 IMPLANT
NEEDLE HYPO 25X1 1.5 SAFETY (NEEDLE) IMPLANT
NS IRRIG 1000ML POUR BTL (IV SOLUTION) ×2 IMPLANT
PACK BASIN DAY SURGERY FS (CUSTOM PROCEDURE TRAY) ×2 IMPLANT
PAD CAST 4YDX4 CTTN HI CHSV (CAST SUPPLIES) ×2 IMPLANT
PADDING CAST COTTON 4X4 STRL (CAST SUPPLIES) ×2
PADDING CAST COTTON 6X4 STRL (CAST SUPPLIES) ×1 IMPLANT
PENCIL BUTTON HOLSTER BLD 10FT (ELECTRODE) ×2 IMPLANT
PLATE TUBULAR 1/3 5H (Plate) ×1 IMPLANT
SCREW CANCELLOUS 4.0X14 (Screw) ×2 IMPLANT
SCREW CANCELLOUS FT 4.0X18MM (Screw) ×1 IMPLANT
SCREW CORTEX ST MATTA 3.5X16MM (Screw) ×3 IMPLANT
SCREW CORTEX ST MATTA 3.5X20 (Screw) ×1 IMPLANT
SCREW CORTEX ST MATTA 3.5X22MM (Screw) ×1 IMPLANT
SHEET MEDIUM DRAPE 40X70 STRL (DRAPES) ×2 IMPLANT
SLEEVE SCD COMPRESS KNEE MED (MISCELLANEOUS) ×2 IMPLANT
SPLINT FAST PLASTER 5X30 (CAST SUPPLIES) ×20
SPLINT PLASTER CAST FAST 5X30 (CAST SUPPLIES) IMPLANT
SPONGE GAUZE 4X4 12PLY (GAUZE/BANDAGES/DRESSINGS) ×2 IMPLANT
SPONGE LAP 4X18 X RAY DECT (DISPOSABLE) ×2 IMPLANT
STAPLER VISISTAT 35W (STAPLE) IMPLANT
STOCKINETTE 6  STRL (DRAPES) ×1
STOCKINETTE 6 STRL (DRAPES) ×1 IMPLANT
STRIP CLOSURE SKIN 1/2X4 (GAUZE/BANDAGES/DRESSINGS) ×2 IMPLANT
SUCTION FRAZIER TIP 10 FR DISP (SUCTIONS) ×2 IMPLANT
SUT ETHILON 3 0 PS 1 (SUTURE) IMPLANT
SUT ETHILON 4 0 PS 2 18 (SUTURE) IMPLANT
SUT MNCRL AB 4-0 PS2 18 (SUTURE) IMPLANT
SUT VIC AB 0 CT1 27 (SUTURE)
SUT VIC AB 0 CT1 27XBRD ANBCTR (SUTURE) IMPLANT
SUT VIC AB 2-0 SH 18 (SUTURE) IMPLANT
SUT VIC AB 3-0 SH 27 (SUTURE)
SUT VIC AB 3-0 SH 27X BRD (SUTURE) IMPLANT
SUT VICRYL 3-0 CR8 SH (SUTURE) ×2 IMPLANT
SUT VICRYL 4-0 PS2 18IN ABS (SUTURE) IMPLANT
SYR BULB 3OZ (MISCELLANEOUS) ×2 IMPLANT
SYR CONTROL 10ML LL (SYRINGE) IMPLANT
TUBE CONNECTING 20X1/4 (TUBING) ×3 IMPLANT
UNDERPAD 30X30 INCONTINENT (UNDERPADS AND DIAPERS) ×2 IMPLANT
WATER STERILE IRR 1000ML POUR (IV SOLUTION) IMPLANT
YANKAUER SUCT BULB TIP NO VENT (SUCTIONS) ×1 IMPLANT

## 2012-03-05 NOTE — Anesthesia Procedure Notes (Addendum)
Anesthesia Regional Block:  Popliteal block  Pre-Anesthetic Checklist: ,, timeout performed, Correct Patient, Correct Site, Correct Laterality, Correct Procedure, Correct Position, site marked, Risks and benefits discussed, pre-op evaluation,  At surgeon's request and post-op pain management  Laterality: Lower and Right  Prep: chloraprep       Needles:  Injection technique: Single-shot  Needle Type: Stimulator Needle - 80      Needle Gauge: 22 and 22 G  Needle insertion depth: 6 cm   Additional Needles:  Procedures: ultrasound guided and nerve stimulator Popliteal block  Nerve Stimulator or Paresthesia:  Response: Twitch elicited, 0.8 mA, 0.3 ms, 6 cm  Additional Responses:   Narrative:  Start time: 03/05/2012 7:10 AM End time: 03/05/2012 7:25 AM Injection made incrementally with aspirations every 5 mL.  Performed by: Personally  Anesthesiologist: Oren Section, MD  Additional Notes: This is a lateral approach to the popliteal fossa area. A stimulator needle is used starting at 1.5 mAmp current and descending as nerve contact is made. Injection of anesthetic is in 5 ml increments with multiple neg aspirations before continuing. Total volume is 40 cc.    Popliteal block Procedure Name: LMA Insertion Performed by: York Grice Pre-anesthesia Checklist: Patient identified, Timeout performed, Emergency Drugs available, Suction available and Patient being monitored Patient Re-evaluated:Patient Re-evaluated prior to inductionOxygen Delivery Method: Circle system utilized Preoxygenation: Pre-oxygenation with 100% oxygen Intubation Type: IV induction Ventilation: Mask ventilation without difficulty LMA: LMA with gastric port inserted LMA Size: 4.0 Number of attempts: 1 Placement Confirmation: breath sounds checked- equal and bilateral and positive ETCO2 Tube secured with: Tape Dental Injury: Teeth and Oropharynx as per pre-operative assessment

## 2012-03-05 NOTE — Transfer of Care (Signed)
Immediate Anesthesia Transfer of Care Note  Patient: Cynthia Hendricks  Procedure(s) Performed: Procedure(s) (LRB): OPEN REDUCTION INTERNAL FIXATION (ORIF) ANKLE FRACTURE (Right)  Patient Location: PACU  Anesthesia Type: General  Level of Consciousness: awake  Airway & Oxygen Therapy: Patient Spontanous Breathing and Patient connected to face mask oxygen  Post-op Assessment: Report given to PACU RN and Post -op Vital signs reviewed and stable  Post vital signs: Reviewed and stable  Complications: No apparent anesthesia complications

## 2012-03-05 NOTE — H&P (Signed)
  PREOPERATIVE H&P  Chief Complaint: Right ankle fracture bimalleolar closed  HPI: Cynthia Hendricks is a 46 y.o. female who presents for preoperative history and physical with a diagnosis of Right ankle fracture bimalleolar closed. Symptoms are rated as moderate to severe, and have been worsening.  This is significantly impairing activities of daily living.  She has elected for surgical management. There is significant displacement of the fracture.  Past Medical History  Diagnosis Date  . Hypertension   . Diabetes mellitus   . Fluid retention   . Sleep apnea 2006    tested mod -tried cpap-not using-said better   Past Surgical History  Procedure Date  . Abdominal hysterectomy 2004    abd hyst-lt ovarian cyst  . Diagnostic laparoscopy 2008    bilat SO-Adhesions  . Dilation and curettage of uterus    History   Social History  . Marital Status: Single    Spouse Name: N/A    Number of Children: N/A  . Years of Education: N/A   Social History Main Topics  . Smoking status: Never Smoker   . Smokeless tobacco: None  . Alcohol Use: No  . Drug Use: No  . Sexually Active:    Other Topics Concern  . None   Social History Narrative  . None   History reviewed. No pertinent family history. No Known Allergies Prior to Admission medications   Medication Sig Start Date End Date Taking? Authorizing Provider  hydrochlorothiazide (HYDRODIURIL) 25 MG tablet Take 25 mg by mouth daily.   Yes Historical Provider, MD  metFORMIN (GLUCOPHAGE) 500 MG tablet Take 500 mg by mouth daily with breakfast.   Yes Historical Provider, MD  Multiple Vitamins-Minerals (MULTIVITAMIN WITH MINERALS) tablet Take 1 tablet by mouth daily.   Yes Historical Provider, MD  ondansetron (ZOFRAN ODT) 8 MG disintegrating tablet 8mg  ODT q4 hours prn nausea 02/28/12 03/06/12 Yes Angus Seller, PA  oxyCODONE-acetaminophen (PERCOCET/ROXICET) 5-325 MG per tablet Take 1-2 tablets by mouth every 6 (six) hours as needed for pain.  02/28/12 03/09/12 Yes John L Molpus, MD     Positive ROS: All other systems have been reviewed and were otherwise negative with the exception of those mentioned in the HPI and as above.  Physical Exam: General: Alert, no acute distress Cardiovascular: No pedal edema Respiratory: No cyanosis, no use of accessory musculature GI: No organomegaly, abdomen is soft and non-tender Skin: No lesions in the area of chief complaint Neurologic: Sensation intact distally Psychiatric: Patient is competent for consent with normal mood and affect Lymphatic: No axillary or cervical lymphadenopathy  MUSCULOSKELETAL: Right ankle has soft tissue swelling and deformity with pain to palpation both laterally and medially intact otherwise the foot.  Assessment: Right ankle fracture bimalleolar closed  Plan: Plan for Procedure(s): OPEN REDUCTION INTERNAL FIXATION (ORIF) ANKLE FRACTURE  The risks benefits and alternatives were discussed with the patient including but not limited to the risks of nonoperative treatment, versus surgical intervention including infection, bleeding, nerve injury,  blood clots, cardiopulmonary complications, morbidity, mortality, among others, and they were willing to proceed.   Jimmylee Ratterree P, MD Cell [phone] Pager [phone]  03/05/2012 7:39 AM

## 2012-03-05 NOTE — Anesthesia Preprocedure Evaluation (Signed)
Anesthesia Evaluation  Patient identified by MRN, date of birth, ID band Patient awake    History of Anesthesia Complications Negative for: history of anesthetic complications  Airway Mallampati: II  Neck ROM: Full    Dental  (+) Teeth Intact   Pulmonary neg pulmonary ROS, sleep apnea ,  breath sounds clear to auscultation        Cardiovascular hypertension, Rhythm:Regular Rate:Normal     Neuro/Psych    GI/Hepatic   Endo/Other    Renal/GU      Musculoskeletal   Abdominal   Peds  Hematology   Anesthesia Other Findings   Reproductive/Obstetrics                           Anesthesia Physical Anesthesia Plan  ASA: II  Anesthesia Plan: General   Post-op Pain Management:    Induction: Intravenous  Airway Management Planned: LMA  Additional Equipment:   Intra-op Plan:   Post-operative Plan:   Informed Consent: I have reviewed the patients History and Physical, chart, labs and discussed the procedure including the risks, benefits and alternatives for the proposed anesthesia with the patient or authorized representative who has indicated his/her understanding and acceptance.     Plan Discussed with: CRNA and Surgeon  Anesthesia Plan Comments:         Anesthesia Quick Evaluation

## 2012-03-05 NOTE — Op Note (Signed)
03/05/2012  PATIENT:  Cynthia Hendricks    PRE-OPERATIVE DIAGNOSIS:  Right ankle fracture bimalleolar closed, involving the distal fibula and posterior malleolus  POST-OPERATIVE DIAGNOSIS:  Same  PROCEDURE:  OPEN REDUCTION INTERNAL FIXATION (ORIF) ANKLE FRACTURE, distal fibula without fixation of the posterior malleolus  SURGEON:  Eulas Post, MD  PHYSICIAN ASSISTANT: Janace Litten, OPA-C, present and scrubbed throughout the case, critical for completion in a timely fashion, and for retraction, instrumentation, and closure.  ANESTHESIA:   General  PREOPERATIVE INDICATIONS:  Cynthia Hendricks is a  46 y.o. female with a diagnosis of Right ankle fracture bimalleolar closed who elected for surgical management to minimize the risk for malunion and nonunion and post-traumatic arthritis.  She had effectively an ankle dislocation with complete disruption of the medial ligamentous structures as well.  The risks benefits and alternatives were discussed with the patient preoperatively including but not limited to the risks of infection, bleeding, nerve injury, cardiopulmonary complications, the need for revision surgery, the need for hardware removal, among others, and the patient was willing to proceed.  OPERATIVE IMPLANTS: Stryker 1/3 tubular plate, with a single interfragmentary lag screw.  OPERATIVE PROCEDURE: The patient was brought to the operating room and placed in the supine position. All bony prominences were padded. General anesthesia was administered. The left lower extremity was prepped and draped in the usual sterile fashion. The leg was elevated and exsanguinated and the tourniquet was inflated. Time out was performed.   Incision was made over the distal fibula and the fracture was exposed and reduced as anatomically as possible with a clamp. There was a substantial amount of comminution. There was a medial sliver of bone that included the articular surface of the distal fibula, which was  completely disrupted, and too small for fixation. This was removed, and actually harvested for bone graft. The lateral cortex of the fibula was extremely comminuted. I was able to align the posterior cortex anatomically, restore my length and rotational alignment. The lateral cortex however was comminuted, and I packed this area with the bone graft from the small piece of medial fibular bone. I also utilized bone from the drill during placement of the screws for this anterior area.   A lag screw was placed bicortically, and had mediocre fixation. The bone quality was moderately poor. I then applied a 1/3 tubular locking plate and secured it proximally and distally with non-locking cancellus screws. I considered using the larger distal fibular plate, however did not do so do to fear of prominence of the hardware. I was also satisfied I was able to get to reasonable distal cancellus screws with an interfragmentary lag screw. I used c-arm to confirm satisfactory reduction and fixation.   The syndesmosis was stressed using live fluoroscopy and found to be stable.  The wounds were irrigated, and closed with vicryl with routine closure for the skin. The wounds were injected with local anesthetic. Sterile gauze was applied followed by a posterior splint. She was awakened and returned to the PACU in stable and satisfactory condition. There were no complications.

## 2012-03-05 NOTE — Anesthesia Postprocedure Evaluation (Signed)
  Anesthesia Post-op Note  Patient: Cynthia Hendricks  Procedure(s) Performed: Procedure(s) (LRB): OPEN REDUCTION INTERNAL FIXATION (ORIF) ANKLE FRACTURE (Right)  Patient Location: PACU  Anesthesia Type: GA combined with regional for post-op pain  Level of Consciousness: awake  Airway and Oxygen Therapy: Patient Spontanous Breathing  Post-op Pain: mild  Post-op Assessment: Post-op Vital signs reviewed, Patient's Cardiovascular Status Stable and Respiratory Function Stable  Post-op Vital Signs: stable  Complications: No apparent anesthesia complications

## 2012-03-05 NOTE — Progress Notes (Signed)
Patient states nausea is resolved and requests discharge home

## 2012-03-05 NOTE — Progress Notes (Signed)
Assisted Dr. Massagee with right, ultrasound guided, popliteal block. Side rails up, monitors on throughout procedure. See vital signs in flow sheet. Tolerated Procedure well. 

## 2012-03-09 ENCOUNTER — Encounter (HOSPITAL_BASED_OUTPATIENT_CLINIC_OR_DEPARTMENT_OTHER): Payer: Self-pay | Admitting: Orthopedic Surgery

## 2012-06-14 ENCOUNTER — Other Ambulatory Visit: Payer: Self-pay | Admitting: Internal Medicine

## 2012-06-14 DIAGNOSIS — N63 Unspecified lump in unspecified breast: Secondary | ICD-10-CM

## 2012-06-22 ENCOUNTER — Ambulatory Visit
Admission: RE | Admit: 2012-06-22 | Discharge: 2012-06-22 | Disposition: A | Discharge status: Home / Self Care | Payer: BC Managed Care – PPO | Source: Ambulatory Visit | Attending: Internal Medicine | Admitting: Internal Medicine

## 2012-06-22 DIAGNOSIS — N63 Unspecified lump in unspecified breast: Secondary | ICD-10-CM

## 2013-01-13 ENCOUNTER — Other Ambulatory Visit: Payer: Self-pay

## 2013-01-13 DIAGNOSIS — Z1231 Encounter for screening mammogram for malignant neoplasm of breast: Secondary | ICD-10-CM

## 2013-02-14 ENCOUNTER — Ambulatory Visit: Payer: BC Managed Care – PPO

## 2013-03-03 ENCOUNTER — Ambulatory Visit
Admission: RE | Admit: 2013-03-03 | Discharge: 2013-03-03 | Disposition: A | Discharge status: Home / Self Care | Payer: BC Managed Care – PPO | Source: Ambulatory Visit

## 2013-03-03 DIAGNOSIS — Z1231 Encounter for screening mammogram for malignant neoplasm of breast: Secondary | ICD-10-CM

## 2014-04-21 ENCOUNTER — Other Ambulatory Visit: Payer: Self-pay

## 2014-04-21 DIAGNOSIS — Z1231 Encounter for screening mammogram for malignant neoplasm of breast: Secondary | ICD-10-CM

## 2014-05-05 ENCOUNTER — Ambulatory Visit: Payer: BC Managed Care – PPO

## 2014-05-15 ENCOUNTER — Ambulatory Visit: Payer: BC Managed Care – PPO

## 2014-06-06 ENCOUNTER — Ambulatory Visit
Admission: RE | Admit: 2014-06-06 | Discharge: 2014-06-06 | Disposition: A | Discharge status: Home / Self Care | Payer: BC Managed Care – PPO | Source: Ambulatory Visit

## 2014-06-06 DIAGNOSIS — Z1231 Encounter for screening mammogram for malignant neoplasm of breast: Secondary | ICD-10-CM

## 2014-06-07 ENCOUNTER — Other Ambulatory Visit: Payer: Self-pay | Admitting: Internal Medicine

## 2014-06-07 DIAGNOSIS — N6452 Nipple discharge: Secondary | ICD-10-CM

## 2014-08-01 ENCOUNTER — Other Ambulatory Visit: Payer: BC Managed Care – PPO

## 2014-08-16 ENCOUNTER — Other Ambulatory Visit: Payer: BC Managed Care – PPO

## 2015-03-30 ENCOUNTER — Ambulatory Visit: Payer: BC Managed Care – PPO | Admitting: *Deleted

## 2015-05-23 ENCOUNTER — Other Ambulatory Visit: Payer: Self-pay | Admitting: Internal Medicine

## 2015-05-23 DIAGNOSIS — Z1231 Encounter for screening mammogram for malignant neoplasm of breast: Secondary | ICD-10-CM

## 2015-06-13 ENCOUNTER — Ambulatory Visit: Payer: BC Managed Care – PPO

## 2015-06-21 ENCOUNTER — Ambulatory Visit
Admission: RE | Admit: 2015-06-21 | Discharge: 2015-06-21 | Disposition: A | Discharge status: Home / Self Care | Payer: BC Managed Care – PPO | Source: Ambulatory Visit | Attending: Internal Medicine | Admitting: Internal Medicine

## 2015-06-21 DIAGNOSIS — Z1231 Encounter for screening mammogram for malignant neoplasm of breast: Secondary | ICD-10-CM

## 2015-12-19 DIAGNOSIS — D071 Carcinoma in situ of vulva: Secondary | ICD-10-CM | POA: Insufficient documentation

## 2016-08-05 ENCOUNTER — Ambulatory Visit
Admission: RE | Admit: 2016-08-05 | Discharge: 2016-08-05 | Disposition: A | Discharge status: Home / Self Care | Payer: BC Managed Care – PPO | Source: Ambulatory Visit | Attending: Internal Medicine | Admitting: Internal Medicine

## 2016-08-05 ENCOUNTER — Other Ambulatory Visit: Payer: Self-pay | Admitting: Internal Medicine

## 2016-08-05 DIAGNOSIS — Z1231 Encounter for screening mammogram for malignant neoplasm of breast: Secondary | ICD-10-CM

## 2017-06-12 DIAGNOSIS — A749 Chlamydial infection, unspecified: Secondary | ICD-10-CM | POA: Insufficient documentation

## 2017-06-26 ENCOUNTER — Other Ambulatory Visit: Payer: Self-pay | Admitting: Specialist

## 2017-06-26 ENCOUNTER — Other Ambulatory Visit: Payer: Self-pay | Admitting: Internal Medicine

## 2017-06-26 DIAGNOSIS — Z1231 Encounter for screening mammogram for malignant neoplasm of breast: Secondary | ICD-10-CM

## 2017-08-06 ENCOUNTER — Ambulatory Visit
Admission: RE | Admit: 2017-08-06 | Discharge: 2017-08-06 | Disposition: A | Discharge status: Home / Self Care | Payer: BC Managed Care – PPO | Source: Ambulatory Visit | Attending: Specialist | Admitting: Specialist

## 2017-08-06 DIAGNOSIS — Z1231 Encounter for screening mammogram for malignant neoplasm of breast: Secondary | ICD-10-CM

## 2018-08-23 DIAGNOSIS — E7800 Pure hypercholesterolemia, unspecified: Secondary | ICD-10-CM | POA: Insufficient documentation

## 2018-08-23 DIAGNOSIS — E119 Type 2 diabetes mellitus without complications: Secondary | ICD-10-CM | POA: Insufficient documentation

## 2018-09-01 DIAGNOSIS — M5416 Radiculopathy, lumbar region: Secondary | ICD-10-CM | POA: Insufficient documentation

## 2018-09-14 ENCOUNTER — Other Ambulatory Visit: Payer: Self-pay | Admitting: Specialist

## 2018-09-14 DIAGNOSIS — Z1231 Encounter for screening mammogram for malignant neoplasm of breast: Secondary | ICD-10-CM

## 2018-09-30 ENCOUNTER — Ambulatory Visit (INDEPENDENT_AMBULATORY_CARE_PROVIDER_SITE_OTHER): Payer: Self-pay

## 2018-09-30 ENCOUNTER — Ambulatory Visit (INDEPENDENT_AMBULATORY_CARE_PROVIDER_SITE_OTHER): Payer: BC Managed Care – PPO | Admitting: Family Medicine

## 2018-09-30 ENCOUNTER — Encounter (INDEPENDENT_AMBULATORY_CARE_PROVIDER_SITE_OTHER): Payer: Self-pay | Admitting: Family Medicine

## 2018-09-30 DIAGNOSIS — M25572 Pain in left ankle and joints of left foot: Secondary | ICD-10-CM | POA: Diagnosis not present

## 2018-09-30 DIAGNOSIS — R6882 Decreased libido: Secondary | ICD-10-CM | POA: Insufficient documentation

## 2018-09-30 DIAGNOSIS — N952 Postmenopausal atrophic vaginitis: Secondary | ICD-10-CM | POA: Insufficient documentation

## 2018-09-30 MED ORDER — NABUMETONE 750 MG PO TABS: 750.0000 mg | ORAL_TABLET | Freq: Two times a day (BID) | ORAL | 6 refills | Status: DC | PRN

## 2018-09-30 MED ORDER — DICLOFENAC SODIUM 1 % TD GEL: 4.0000 g | Freq: Four times a day (QID) | TRANSDERMAL | 6 refills | Status: DC | PRN

## 2018-09-30 NOTE — Patient Instructions (Signed)
   Diagnosis:  Inflammation of tendon sheath of tibialis posterior tendon of ankle.

## 2018-09-30 NOTE — Progress Notes (Signed)
Office Visit Note   Patient: Cynthia Hendricks           Date of Birth: Jan 05, 1966           MRN: [id number] Visit Date: 09/30/2018 Requested by: Ralene Ok, MD [address] [place], [place] [address] PCP: Ralene Ok, MD  Subjective: Chief Complaint  Patient presents with  . Left Ankle - Pain    Left ankle pain and swelling (medial & posterior) x 2 months. NKI    HPI: She is a 53 year old with left ankle pain.  Symptoms started in December while on her honeymoon.  [place] and did some walking on the beach and other tourist activities, but nothing strenuous.  She did not injure her ankle but started noticing pain and swelling on the medial side of her ankle during her trip.  The swelling and pain have persisted, makes her walk with a slight limp.  She never sought any redness, there has not been any warmth, no fever or chills.  No other joints are bothering her.  No personal or family history of gout.  She was not placed on any new medications around the time of her symptoms.  Recently she had an episode of sciatica which was treated with a steroid taper, but it did not make much difference in her ankle pain.              ROS: She has hypertension and diabetes which are under better control lately.  Other systems were reviewed and are negative.  Objective: Vital Signs: There were no vitals taken for this visit.  Physical Exam:  Left ankle: There is soft tissue swelling posterior to the medial malleolus and she is tender to palpation there.  The tenderness extends toward the navicular.  No ankle joint effusion, no warmth or erythema.  There is pain with supination of the foot against resistance.  No pain with flexion of the great toe against resistance or with ankle dorsiflexion.  Imaging: X-rays left ankle: There is degenerative change at the talonavicular joint and a dorsal spur on the anterior talus.  No other significant bony abnormality seen, no sign  of stress fracture.  Musculoskeletal ultrasound: Limited ultrasound of the medial ankle shows tenosynovitis of the tibialis posterior tendon sheath, the tendon appears to be intact.  No obvious tears.  There is hyperemia on power Doppler imaging.   Assessment & Plan: 1.  Left ankle tibialis posterior tenosynovitis -Trial of Relafen by mouth, Voltaren gel topically, physical therapy for iontophoresis.  If symptoms do not improve then we will inject the tendon sheath.  Follow-up as needed.     Procedures: No procedures performed  No notes on file     PMFS History: Patient Active Problem List   Diagnosis Date Noted  . 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 Reduced libido 09/30/2018  . Lumbar radiculopathy 09/01/2018  . Hypercholesterolemia 08/23/2018  . Type 2 diabetes mellitus (HCC) 08/23/2018  . Chlamydial infection 06/12/2017  . 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 Fracture of fibula, distal, right, closed 03/05/2012   Past Medical History:  Diagnosis Date  . Diabetes mellitus   . Fluid retention   . Fracture of fibula, distal, right, closed 03/05/2012  . Hypertension   . Sleep apnea 2006   tested mod -tried cpap-not using-said better    History reviewed. No pertinent family history.  Past Surgical History:  Procedure Laterality Date  . ABDOMINAL HYSTERECTOMY  2004   abd hyst-lt ovarian cyst  . DIAGNOSTIC LAPAROSCOPY  2008   bilat SO-Adhesions  . DILATION AND CURETTAGE OF UTERUS    . ORIF ANKLE FRACTURE  03/05/2012   Procedure: OPEN REDUCTION INTERNAL FIXATION (ORIF) ANKLE FRACTURE;  Surgeon: Eulas Post, MD;  Location: [facility];  Service: Orthopedics;  Laterality: Right;  OPEN TREATMENT BIMALLEOLAR ANKLE INCLUDES INTERNAL FIXATION   Social History   Occupational History  . Not on file  Tobacco Use  . Smoking status: Never Smoker  Substance and Sexual Activity  . Alcohol use: No  . Drug use: No  . Sexual activity: Not on  file

## 2018-10-15 ENCOUNTER — Ambulatory Visit
Admission: RE | Admit: 2018-10-15 | Discharge: 2018-10-15 | Disposition: A | Discharge status: Home / Self Care | Payer: BC Managed Care – PPO | Source: Ambulatory Visit | Attending: Specialist | Admitting: Specialist

## 2018-10-15 DIAGNOSIS — Z1231 Encounter for screening mammogram for malignant neoplasm of breast: Secondary | ICD-10-CM

## 2018-10-29 ENCOUNTER — Ambulatory Visit: Payer: BC Managed Care – PPO | Admitting: Physical Therapy

## 2018-12-14 ENCOUNTER — Ambulatory Visit (INDEPENDENT_AMBULATORY_CARE_PROVIDER_SITE_OTHER): Payer: BC Managed Care – PPO | Admitting: Family Medicine

## 2018-12-14 ENCOUNTER — Other Ambulatory Visit: Payer: Self-pay

## 2018-12-14 ENCOUNTER — Encounter: Payer: Self-pay | Admitting: Family Medicine

## 2018-12-14 DIAGNOSIS — M25572 Pain in left ankle and joints of left foot: Secondary | ICD-10-CM

## 2018-12-14 MED ORDER — ETODOLAC 400 MG PO TABS: 400.0000 mg | ORAL_TABLET | Freq: Two times a day (BID) | ORAL | 3 refills | Status: DC | PRN

## 2018-12-14 NOTE — Progress Notes (Signed)
   Office Visit Note   Patient: Cynthia Hendricks           Date of Birth: 1966/04/26           MRN: [id number] Visit Date: 12/14/2018 Requested by: Ralene Ok, MD [address] [place], [place] [address] PCP: Ralene Ok, MD  Subjective: Chief Complaint  Patient presents with  . Left Ankle - Follow-up, Pain    Continues to have pain medial aspect of ankle. Some days are better than others - "today is a good day." Continues to swell. Problems started in December 2019, [place].    HPI: She is now about 5 months since the onset of left ankle pain.  Still having pain, anti-inflammatories have not helped.  Pain continues to be on the medial aspect.              ROS: No fevers or chills, no respiratory symptoms.  All other systems were reviewed and are negative.  Objective: Vital Signs: There were no vitals taken for this visit.  Physical Exam:  General:  Alert and oriented, in no acute distress. Pulm:  Breathing unlabored. Psy:  Normal mood, congruent affect. Skin: No rash on her skin, no erythema. Left ankle: She remains very swollen and tender along the tibialis posterior tendon just posterior to the medial malleolus.  There is pain with supination of the foot against resistance, no pain with flexion of the great toe.  Imaging: None today.  Assessment & Plan: 1.  Continued left ankle pain due to tibialis posterior tenosynovitis -Discussed various options with her and elected to treat with a fracture boot for the next 3 to 4 weeks, [place] for inflammation.  Referral given for physical therapy to do iontophoresis treatments. -If symptoms are not improving we will contemplate cortisone injection.     Procedures: No procedures performed  No notes on file     PMFS History: Patient Active Problem List   Diagnosis Date Noted  . 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 Reduced libido 09/30/2018  . Lumbar radiculopathy 09/01/2018  . Hypercholesterolemia 08/23/2018  . Type 2 diabetes  mellitus (HCC) 08/23/2018  . Chlamydial infection 06/12/2017  . 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 Fracture of fibula, distal, right, closed 03/05/2012   Past Medical History:  Diagnosis Date  . Diabetes mellitus   . Fluid retention   . Fracture of fibula, distal, right, closed 03/05/2012  . Hypertension   . Sleep apnea 2006   tested mod -tried cpap-not using-said better    History reviewed. No pertinent family history.  Past Surgical History:  Procedure Laterality Date  . ABDOMINAL HYSTERECTOMY  2004   abd hyst-lt ovarian cyst  . DIAGNOSTIC LAPAROSCOPY  2008   bilat SO-Adhesions  . DILATION AND CURETTAGE OF UTERUS    . ORIF ANKLE FRACTURE  03/05/2012   Procedure: OPEN REDUCTION INTERNAL FIXATION (ORIF) ANKLE FRACTURE;  Surgeon: Eulas Post, MD;  Location: [facility];  Service: Orthopedics;  Laterality: Right;  OPEN TREATMENT BIMALLEOLAR ANKLE INCLUDES INTERNAL FIXATION   Social History   Occupational History  . Not on file  Tobacco Use  . Smoking status: Never Smoker  Substance and Sexual Activity  . Alcohol use: No  . Drug use: No  . Sexual activity: Not on file

## 2019-01-12 ENCOUNTER — Encounter: Payer: Self-pay | Admitting: Family Medicine

## 2019-01-12 ENCOUNTER — Other Ambulatory Visit: Payer: Self-pay

## 2019-01-12 ENCOUNTER — Ambulatory Visit: Payer: BC Managed Care – PPO | Admitting: Family Medicine

## 2019-01-12 DIAGNOSIS — M25572 Pain in left ankle and joints of left foot: Secondary | ICD-10-CM | POA: Diagnosis not present

## 2019-01-12 NOTE — Progress Notes (Signed)
   Office Visit Note   Patient: Cynthia Hendricks           Date of Birth: 06/04/1966           MRN: [id number] Visit Date: 01/12/2019 Requested by: Ralene Ok, MD [address] [place], [place] [address] PCP: Ralene Ok, MD  Subjective: Chief Complaint  Patient presents with  . Left Ankle - Pain, Follow-up    Wore the fracture boot x 3 weeks. Out of boot now x a little over 1 week. Ankle still swells - some days worse than others. Doing home exercises.    HPI: She is here for follow-up left ankle tibialis posterior tenosynovitis.  No improvement, she wore the fracture boot for a couple weeks but did not notice that much difference.  She is frustrated by her ongoing pain and swelling.              ROS: No fevers or chills or respiratory symptoms.  All other systems were reviewed and are negative.  Objective: Vital Signs: There were no vitals taken for this visit.  Physical Exam:  General:  Alert and oriented, in no acute distress. Pulm:  Breathing unlabored. Psy:  Normal mood, congruent affect. Skin: No rash on her skin. Left ankle: She remains swollen and tender to touch along the tibialis posterior tendon medial ankle.  Pain with supination of the foot against resistance.  Imaging: None today.  Assessment & Plan: 1.  Chronic left ankle tibialis posterior tenosynovitis -Discussed options with her and elected to try iontophoresis in physical therapy.  If still no improvement then we could inject with cortisone.     Procedures: No procedures performed  No notes on file     PMFS History: Patient Active Problem List   Diagnosis Date Noted  . 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 Reduced libido 09/30/2018  . Lumbar radiculopathy 09/01/2018  . Hypercholesterolemia 08/23/2018  . Type 2 diabetes mellitus (HCC) 08/23/2018  . Chlamydial infection 06/12/2017  . 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 Fracture of fibula, distal, right, closed 03/05/2012    Past Medical History:  Diagnosis Date  . Diabetes mellitus   . Fluid retention   . Fracture of fibula, distal, right, closed 03/05/2012  . Hypertension   . Sleep apnea 2006   tested mod -tried cpap-not using-said better    History reviewed. No pertinent family history.  Past Surgical History:  Procedure Laterality Date  . ABDOMINAL HYSTERECTOMY  2004   abd hyst-lt ovarian cyst  . DIAGNOSTIC LAPAROSCOPY  2008   bilat SO-Adhesions  . DILATION AND CURETTAGE OF UTERUS    . ORIF ANKLE FRACTURE  03/05/2012   Procedure: OPEN REDUCTION INTERNAL FIXATION (ORIF) ANKLE FRACTURE;  Surgeon: Eulas Post, MD;  Location: [facility];  Service: Orthopedics;  Laterality: Right;  OPEN TREATMENT BIMALLEOLAR ANKLE INCLUDES INTERNAL FIXATION   Social History   Occupational History  . Not on file  Tobacco Use  . Smoking status: Never Smoker  Substance and Sexual Activity  . Alcohol use: No  . Drug use: No  . Sexual activity: Not on file

## 2019-01-21 ENCOUNTER — Ambulatory Visit: Payer: BC Managed Care – PPO | Attending: Internal Medicine

## 2019-01-21 ENCOUNTER — Other Ambulatory Visit: Payer: Self-pay

## 2019-01-21 DIAGNOSIS — G8929 Other chronic pain: Secondary | ICD-10-CM | POA: Insufficient documentation

## 2019-01-21 DIAGNOSIS — R293 Abnormal posture: Secondary | ICD-10-CM | POA: Insufficient documentation

## 2019-01-21 DIAGNOSIS — M62830 Muscle spasm of back: Secondary | ICD-10-CM | POA: Diagnosis present

## 2019-01-21 DIAGNOSIS — M545 Low back pain: Secondary | ICD-10-CM | POA: Diagnosis present
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
## 2019-01-28 ENCOUNTER — Ambulatory Visit: Payer: BC Managed Care – PPO

## 2019-01-28 ENCOUNTER — Other Ambulatory Visit: Payer: Self-pay

## 2019-01-28 DIAGNOSIS — R293 Abnormal posture: Secondary | ICD-10-CM

## 2019-01-28 DIAGNOSIS — M62830 Muscle spasm of back: Secondary | ICD-10-CM

## 2019-01-28 DIAGNOSIS — M545 Low back pain, unspecified: Secondary | ICD-10-CM

## 2019-01-28 DIAGNOSIS — G8929 Other chronic pain: Secondary | ICD-10-CM

## 2019-01-28 NOTE — Therapy (Signed)
[facility] [address] [place], [place] [id number] Phone: [phone]   Fax:  [phone]  Physical Therapy Treatment  Patient Details  Name: Cynthia PatellaRobin Headen Hendricks MRN: [id number] Date of Birth: 02/28/1966 Referring Provider (PT): Loyal Bubaay Moreira, MD   Encounter Date: 01/28/2019  PT End of Session - 01/28/19 0812    Visit Number  2    Number of Visits  12    Date for PT Re-Evaluation  03/04/19    Authorization Type  [employer]    PT Start Time  0810    PT Stop Time  0905    PT Time Calculation (min)  55 min    Activity Tolerance  Patient tolerated treatment well    Behavior During Therapy  [facility] for tasks assessed/performed       Past Medical History:  Diagnosis Date  . Diabetes mellitus   . Fluid retention   . Fracture of fibula, distal, right, closed 03/05/2012  . Hypertension   . Sleep apnea 2006   tested mod -tried cpap-not using-said better    Past Surgical History:  Procedure Laterality Date  . ABDOMINAL HYSTERECTOMY  2004   abd hyst-lt ovarian cyst  . DIAGNOSTIC LAPAROSCOPY  2008   bilat SO-Adhesions  . DILATION AND CURETTAGE OF UTERUS    . ORIF ANKLE FRACTURE  03/05/2012   Procedure: OPEN REDUCTION INTERNAL FIXATION (ORIF) ANKLE FRACTURE;  Surgeon: Eulas PostJoshua P Landau, MD;  Location: [facility];  Service: Orthopedics;  Laterality: Right;  OPEN TREATMENT BIMALLEOLAR ANKLE INCLUDES INTERNAL FIXATION    There were no vitals filed for this visit.  Subjective Assessment - 01/28/19 0815    Subjective  Feel better than last time.    Pain Score  5     Pain Location  Back    Pain Orientation  Right    Pain Descriptors / Indicators  Nagging;Radiating    Pain Type  Chronic pain    Pain Onset  More than a month ago    Pain Frequency  Constant    Aggravating Factors   ?    Pain Relieving Factors  heat , meds                       [facility] Adult PT Treatment/Exercise - 01/28/19 0001      Exercises    Exercises  Lumbar      Lumbar Exercises: Stretches   Single Knee to Chest Stretch  Right;2 reps;30 seconds    Piriformis Stretch  Right;2 reps;20 seconds    Other Lumbar Stretch Exercise  quadratus stretches      Lumbar Exercises: Aerobic   Nustep  L4 5 min UE /LE      Lumbar Exercises: Supine   Ab Set  5 reps    Pelvic Tilt  10 reps    Pelvic Tilt Limitations  much cuing and still poor  conrtraction      Modalities   Modalities  Ultrasound;Iontophoresis      Ultrasound   Ultrasound Location  RT lower back    Ultrasound Parameters  100% 1MHZ 1.6 Wcm2    Ultrasound Goals  Pain      Iontophoresis   Type of Iontophoresis  Dexamethasone    Location  RT lower back SI area    Dose  1cc    Time  4-6 hours      Manual Therapy   Manual Therapy  Joint mobilization;Soft tissue mobilization;Manual Traction  Joint Mobilization  PA Gr 2-3 RT lower back    Soft tissue mobilization  RT lower back    Manual Traction  RT leg pulls              PT Education - 01/28/19 0909    Education Details  HEP , ionto cautions    Person(s) Educated  Patient    Methods  Explanation;Tactile cues;Verbal cues;Handout;Demonstration    Comprehension  Returned demonstration;Verbalized understanding       PT Short Term Goals - 01/28/19 0911      PT SHORT TERM GOAL #1   Title  She will be independent with initial HEP    Status  On-going        PT Long Term Goals - 01/21/19 0907      PT LONG TERM GOAL #1   Title  She will be independnet with all hEP issued    Time  6    Period  Weeks    Status  New      PT LONG TERM GOAL #2   Title  she will report pain as intermittant  and n more than 2/10    Time  6    Period  Weeks    Status  New      PT LONG TERM GOAL #3   Title  She will report 75% improvement in work and home tasks due to decreased pain    Time  6    Period  Weeks    Status  New            Plan - 01/28/19 0813    Clinical Impression Statement  She report  doing some better. Tolerated newer exercises.   Continue Manual and modalities    PT Treatment/Interventions  Passive range of motion;Dry needling;Taping;Manual techniques;Patient/family education;Therapeutic exercise;Ultrasound;Moist Heat;Iontophoresis 4mg /ml Dexamethasone;Electrical Stimulation    PT Next Visit Plan  review HEP ,  modalities and manual for pain and spasm   ionto if no issues    PT Home Exercise Plan  quadrus lumborum stretches, PPT , transverse abdom,  knee to chest    Consulted and Agree with Plan of Care  Patient       Patient will benefit from skilled therapeutic intervention in order to improve the following deficits and impairments:  Pain, Postural dysfunction, Decreased activity tolerance, Increased muscle spasms, Decreased range of motion  Visit Diagnosis: 1. Muscle spasm of back   2. Abnormal posture   3. Chronic right-sided low back pain, unspecified whether sciatica present        Problem List Patient Active Problem List   Diagnosis Date Noted  . 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 Reduced libido 09/30/2018  . Lumbar radiculopathy 09/01/2018  . Hypercholesterolemia 08/23/2018  . Type 2 diabetes mellitus ([place]) 08/23/2018  . Chlamydial infection 06/12/2017  . 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 Fracture of fibula, distal, right, closed 03/05/2012    Darrel Hoover  PT 01/28/2019, 9:13 AM  [facility] [address] [place], [place], [address] Phone: [phone]   Fax:  [phone]  Name: Cynthia Hendricks MRN: [id number] Date of Birth: Jul 15, 1966

## 2019-01-28 NOTE — Patient Instructions (Addendum)
From cabinet knee to chest to RT shoulder and to left x2 2x/day 30 sec,  Transverse abdom and PPT x 5-10 reps 5-10 sec hold.                                                                                   IONTOPHORESIS PATIENT PRECAUTIONS & CONTRAINDICATIONS:  . Redness under one or both electrodes can occur.  This characterized by a uniform redness that usually disappears within 12 hours of treatment. . Small pinhead size blisters may result in response to the drug.  Contact your physician if the problem persists more than 24 hours. . On rare occasions, iontophoresis therapy can result in temporary skin reactions such as rash, inflammation, irritation or burns.  The skin reactions may be the result of individual sensitivity to the ionic solution used, the condition of the skin at the start of treatment, reaction to the materials in the electrodes, allergies or sensitivity to dexamethasone, or a poor connection between the patch and your skin.  Discontinue using iontophoresis if you have any of these reactions and report to your therapist. . Remove the Patch or electrodes if you have any undue sensation of pain or burning during the treatment and report discomfort to your therapist. . Tell your Therapist if you have had known adverse reactions to the application of electrical current. . If using the Patch, the LED light will turn off when treatment is complete and the patch can be removed.  Approximate treatment time is 1-3 hours.  Remove the patch when light goes off or after 6 hours. . The Patch can be worn during normal activity, however excessive motion where the electrodes have been placed can cause poor contact between the skin and the electrode or uneven electrical current resulting in greater risk of skin irritation. Marland Kitchen Keep out of the reach of children.   . DO NOT use if you have a cardiac pacemaker or any other electrically sensitive implanted device. . DO NOT use if you have a known  sensitivity to dexamethasone. . DO NOT use during Magnetic Resonance Imaging (MRI). . DO NOT use over broken or compromised skin (e.g. sunburn, cuts, or acne) due to the increased risk of skin reaction. . DO NOT SHAVE over the area to be treated:  To establish good contact between the Patch and the skin, excessive hair may be clipped. . DO NOT place the Patch or electrodes on or over your eyes, directly over your heart, or brain. . DO NOT reuse the Patch or electrodes as this may cause burns to occur.

## 2019-02-04 ENCOUNTER — Other Ambulatory Visit: Payer: Self-pay

## 2019-02-04 ENCOUNTER — Ambulatory Visit: Payer: BC Managed Care – PPO

## 2019-02-04 DIAGNOSIS — R293 Abnormal posture: Secondary | ICD-10-CM

## 2019-02-04 DIAGNOSIS — M545 Low back pain, unspecified: Secondary | ICD-10-CM

## 2019-02-04 DIAGNOSIS — G8929 Other chronic pain: Secondary | ICD-10-CM

## 2019-02-04 DIAGNOSIS — M62830 Muscle spasm of back: Secondary | ICD-10-CM

## 2019-02-04 NOTE — Patient Instructions (Signed)
Shoulder Protraction and Retraction    With arm at side, elbow bent at 90, position hand as if holding a saw. Push forward, fully extending elbow. Pull backward so bent elbow extends beyond back. Repeat 15-20___ times. Do _1__ times per day.  Use red band and do both hands   Copyright  VHI. All rights reserved.  SHOULDER: Extension (Band)    Start with arm slightly forward. Holding band, pull backward, past hip, keeping elbow straight. Do not swing arm. Hold ___ seconds. Use __red______ band. 15-20___ reps per set, __1_ sets per day, _5__ days per week  Copyright  VHI. All rights reserved.  Trunk Rotation    Sit with feet flat on floor, hands clasped out in front. Rotate trunk toward right side. Hold ____ seconds.   DON'T ROTATE YOUR BODY Repeat _15-20___ times per session. Do __1__ sessions per day.  RT and LT  Repeat to opposite side. DO STANDING      ATTACH BAND TO DOOR KNOB.  Copyright  VHI. All rights reserved.

## 2019-02-04 NOTE — Therapy (Signed)
[place], [place], [address] Phone: [phone]   Fax:  [phone]  Physical Therapy Treatment  Patient Details  Name: Cynthia Hendricks MRN: [id number] Date of Birth: 03/10/1966 Referring Provider (PT): Norina Buzzard, MD   Encounter Date: 02/04/2019  PT End of Session - 02/04/19 0801    Visit Number  3    Number of Visits  12    Date for PT Re-Evaluation  03/04/19    Authorization Type  BCBS    PT Start Time  0800    PT Stop Time  0850    PT Time Calculation (min)  50 min    Activity Tolerance  Patient tolerated treatment well    Behavior During Therapy  [facility] for tasks assessed/performed       Past Medical History:  Diagnosis Date  . Diabetes mellitus   . Fluid retention   . Fracture of fibula, distal, right, closed 03/05/2012  . Hypertension   . Sleep apnea 2006   tested mod -tried cpap-not using-said better    Past Surgical History:  Procedure Laterality Date  . ABDOMINAL HYSTERECTOMY  2004   abd hyst-lt ovarian cyst  . DIAGNOSTIC LAPAROSCOPY  2008   bilat SO-Adhesions  . DILATION AND CURETTAGE OF UTERUS    . ORIF ANKLE FRACTURE  03/05/2012   Procedure: OPEN REDUCTION INTERNAL FIXATION (ORIF) ANKLE FRACTURE;  Surgeon: Johnny Bridge, MD;  Location: [place];  Service: Orthopedics;  Laterality: Right;  OPEN TREATMENT BIMALLEOLAR ANKLE INCLUDES INTERNAL FIXATION    There were no vitals filed for this visit.  Subjective Assessment - 02/04/19 0805    Subjective  No pain this AM . Feels pretty good today    Currently in Pain?  No/denies                       [facility] Adult PT Treatment/Exercise - 02/04/19 0001      Lumbar Exercises: Stretches   Single Knee to Chest Stretch  Right;2 reps;30 seconds    Piriformis Stretch  Right;2 reps;20 seconds    Other Lumbar Stretch Exercise  quadratus stretches over black ball x 3       Lumbar Exercises: Aerobic   Nustep  L4 5 min UE /LE      Lumbar Exercises: Standing   Shoulder Extension  Both;15 reps    Theraband Level (Shoulder Extension)  Level 2 (Red)    Other Standing Lumbar Exercises  and rotation RT and LT with min trunk rotation    Other Standing Lumbar Exercises  shoulder punches with red band      Lumbar Exercises: Supine   Ab Set  --    AB Set Limitations  12 reps    Pelvic Tilt  15 reps    Pelvic Tilt Limitations  she did correctly with only minor cues.     Bridge  15 reps      Lumbar Exercises: Sidelying   Clam  Right;15 reps    Hip Abduction  Right;15 reps      Manual Therapy   Joint Mobilization  PA Gr 2-3 RT lower back    Soft tissue mobilization  RT lower back    Manual Traction  --             PT Education - 02/04/19 0856    Education Details  hep sHE IS TO DO ALL EXERCISE STANDING AS TOLERATED /NO PAIN  Person(s) Educated  Patient    Methods  Explanation;Demonstration;Tactile cues;Verbal cues;Handout    Comprehension  Verbalized understanding;Returned demonstration       PT Short Term Goals - 02/04/19 0806      PT SHORT TERM GOAL #1   Title  She will be independent with initial HEP    Status  Achieved      PT SHORT TERM GOAL #2   Title  She will report pain decr 25% or more    Baseline  improved    Status  Achieved      PT SHORT TERM GOAL #3   Title  She will be able to get out of seat a work with 25% decr stiffness/soreness    Status  Achieved        PT Long Term Goals - 01/21/19 0907      PT LONG TERM GOAL #1   Title  She will be independnet with all hEP issued    Time  6    Period  Weeks    Status  New      PT LONG TERM GOAL #2   Title  she will report pain as intermittant  and n more than 2/10    Time  6    Period  Weeks    Status  New      PT LONG TERM GOAL #3   Title  She will report 75% improvement in work and home tasks due to decreased pain    Time  6    Period  Weeks    Status  New            Plan - 02/04/19  0801    Clinical Impression Statement  Improving as today no pain and overall better per pt.  She reports much improved and doing HEP . No pain post session    PT Treatment/Interventions  Passive range of motion;Dry needling;Taping;Manual techniques;Patient/family education;Therapeutic exercise;Ultrasound;Moist Heat;Iontophoresis 4mg /ml Dexamethasone;Electrical Stimulation    PT Next Visit Plan  review HEP ,  modalities and manual for pain and spasm   ionto if needed    PT Home Exercise Plan  quadrus lumborum stretches, PPT , transverse abdom,  knee to chest, band with shoulder extension , punches and moving hands shoulder to shoulder distance min tfunk rotaiton    Consulted and Agree with Plan of Care  Patient       Patient will benefit from skilled therapeutic intervention in order to improve the following deficits and impairments:  Pain, Postural dysfunction, Decreased activity tolerance, Increased muscle spasms, Decreased range of motion  Visit Diagnosis: 1. Chronic right-sided low back pain, unspecified whether sciatica present   2. Muscle spasm of back   3. Abnormal posture        Problem List Patient Active Problem List   Diagnosis Date Noted  . 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 Reduced libido 09/30/2018  . Lumbar radiculopathy 09/01/2018  . Hypercholesterolemia 08/23/2018  . Type 2 diabetes mellitus (HCC) 08/23/2018  . Chlamydial infection 06/12/2017  . 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 Fracture of fibula, distal, right, closed 03/05/2012    Caprice RedChasse, Cynthia Hendricks  PT 02/04/2019, 9:01 AM  [facility] [address] [place], [place], [address] Phone: [phone]   Fax:  [phone]  Name: Cynthia PatellaRobin Headen Heater MRN: [id number] Date of Birth: 01/15/1966

## 2019-02-10 ENCOUNTER — Ambulatory Visit: Payer: BC Managed Care – PPO

## 2019-02-17 ENCOUNTER — Ambulatory Visit: Payer: BC Managed Care – PPO | Attending: Internal Medicine

## 2019-02-17 ENCOUNTER — Other Ambulatory Visit: Payer: Self-pay

## 2019-02-17 DIAGNOSIS — M545 Low back pain, unspecified: Secondary | ICD-10-CM

## 2019-02-17 DIAGNOSIS — R293 Abnormal posture: Secondary | ICD-10-CM

## 2019-02-17 DIAGNOSIS — G8929 Other chronic pain: Secondary | ICD-10-CM | POA: Diagnosis present

## 2019-02-17 DIAGNOSIS — M62830 Muscle spasm of back: Secondary | ICD-10-CM

## 2019-02-17 NOTE — Therapy (Signed)
[place], [place], [address] Phone: [phone]   Fax:  [phone]  Physical Therapy Treatment  Patient Details  Name: Cynthia Hendricks MRN: [id number] Date of Birth: May 04, 1966 Referring Provider (PT): Norina Buzzard, MD   Encounter Date: 02/17/2019  PT End of Session - 02/17/19 1423    Visit Number  4    Number of Visits  12    Date for PT Re-Evaluation  03/04/19    Authorization Type  BCBS    PT Start Time  0222    PT Stop Time  0315    PT Time Calculation (min)  53 min    Activity Tolerance  Patient tolerated treatment well    Behavior During Therapy  [facility] for tasks assessed/performed       Past Medical History:  Diagnosis Date  . Diabetes mellitus   . Fluid retention   . Fracture of fibula, distal, right, closed 03/05/2012  . Hypertension   . Sleep apnea 2006   tested mod -tried cpap-not using-said better    Past Surgical History:  Procedure Laterality Date  . ABDOMINAL HYSTERECTOMY  2004   abd hyst-lt ovarian cyst  . DIAGNOSTIC LAPAROSCOPY  2008   bilat SO-Adhesions  . DILATION AND CURETTAGE OF UTERUS    . ORIF ANKLE FRACTURE  03/05/2012   Procedure: OPEN REDUCTION INTERNAL FIXATION (ORIF) ANKLE FRACTURE;  Surgeon: Johnny Bridge, MD;  Location: [place];  Service: Orthopedics;  Laterality: Right;  OPEN TREATMENT BIMALLEOLAR ANKLE INCLUDES INTERNAL FIXATION    There were no vitals filed for this visit.  Subjective Assessment - 02/17/19 1431    Subjective  No pain this PM . Feels pretty good today Last pain was really last week    Currently in Pain?  No/denies                       [facility] Adult PT Treatment/Exercise - 02/17/19 0001      Self-Care   Self-Care  Other Self-Care Comments    Other Self-Care Comments   f tennis balls for STW supine and standing.       Lumbar Exercises: Stretches   Single Knee to Chest Stretch  Right;2 reps;30 seconds     Lower Trunk Rotation  2 reps;30 seconds    Lower Trunk Rotation Limitations  RT and LT    Piriformis Stretch  Right;20 seconds;3 reps    Other Lumbar Stretch Exercise  quadratus stretches x 2 25 sec      Lumbar Exercises: Aerobic   Nustep  L5 5 min UE /LE      Lumbar Exercises: Standing   Other Standing Lumbar Exercises  10 pound kettle bell dead lift in limited ROm with cues for spinal alignment with use of dowel.       Lumbar Exercises: Sidelying   Clam  Right;20 reps    Clam Limitations  2x10    Hip Abduction  Right    Hip Abduction Weights (lbs)  2x10      Lumbar Exercises: Prone   Other Prone Lumbar Exercises  bilaterral ball squeeze       Manual Therapy   Joint Mobilization  PA Gr 2-3 RT lower back    Soft tissue mobilization  RT lower back             PT Education - 02/17/19 1528    Education Details  kettle bell dead lift with straight spine.  Person(s) Educated  Patient    Methods  Explanation;Demonstration;Tactile cues;Verbal cues;Handout    Comprehension  Returned demonstration;Verbalized understanding       PT Short Term Goals - 02/17/19 1524      PT SHORT TERM GOAL #1   Title  She will be independent with initial HEP    Status  Achieved      PT SHORT TERM GOAL #2   Title  She will report pain decr 25% or more    Status  Achieved      PT SHORT TERM GOAL #3   Title  She will be able to get out of seat a work with 25% decr stiffness/soreness    Status  Achieved        PT Long Term Goals - 02/17/19 1524      PT LONG TERM GOAL #1   Title  She will be independnet with all hEP issued    Status  On-going      PT LONG TERM GOAL #2   Title  she will report pain as intermittant  and n more than 2/10    Baseline  met in past week    Status  Achieved      PT LONG TERM GOAL #3   Title  She will report 75% improvement in work and home tasks due to decreased pain    Status  Achieved            Plan - 02/17/19 1423    Clinical  Impression Statement  Sore after nustep but able to do all exercisse. She is pleased with progress . declined modalities. One more visit to finish HEP    PT Treatment/Interventions  Passive range of motion;Dry needling;Taping;Manual techniques;Patient/family education;Therapeutic exercise;Ultrasound;Moist Heat;Iontophoresis 36m/ml Dexamethasone;Electrical Stimulation    PT Next Visit Plan  review HEP Probable discharge.    PT Home Exercise Plan  quadrus lumborum stretches, PPT , transverse abdom,  knee to chest, band with shoulder extension , punches and moving hands shoulder to shoulder distance min trunk rotaiton, tennis balls for STW    Consulted and Agree with Plan of Care  Patient       Patient will benefit from skilled therapeutic intervention in order to improve the following deficits and impairments:  Pain, Postural dysfunction, Decreased activity tolerance, Increased muscle spasms, Decreased range of motion  Visit Diagnosis: 1. Chronic right-sided low back pain, unspecified whether sciatica present   2. Muscle spasm of back   3. Abnormal posture        Problem List Patient Active Problem List   Diagnosis Date Noted  . 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 Reduced libido 09/30/2018  . Lumbar radiculopathy 09/01/2018  . Hypercholesterolemia 08/23/2018  . Type 2 diabetes mellitus ([facility]Ret[facility] 08/23/2018  . Chlamydial infection 06/12/2017  . 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 Fracture of fibula, distal, right, closed 03/05/2012    CDarrel Hoover PT 02/17/2019, 3:30 PM  [facility] StreetGTam[place] [address]Phone: [phone]  Fax:  [phone] Name: Cynthia TaussigMRN: [id number] of Birth: 522-Aug-1967

## 2019-03-01 ENCOUNTER — Ambulatory Visit: Payer: BC Managed Care – PPO

## 2019-03-08 ENCOUNTER — Ambulatory Visit: Payer: BC Managed Care – PPO

## 2019-03-08 ENCOUNTER — Other Ambulatory Visit: Payer: Self-pay

## 2019-03-08 DIAGNOSIS — G8929 Other chronic pain: Secondary | ICD-10-CM

## 2019-03-08 DIAGNOSIS — M545 Low back pain: Secondary | ICD-10-CM | POA: Diagnosis not present

## 2019-03-08 DIAGNOSIS — R293 Abnormal posture: Secondary | ICD-10-CM

## 2019-03-08 DIAGNOSIS — M62830 Muscle spasm of back: Secondary | ICD-10-CM

## 2019-03-08 NOTE — Therapy (Signed)
[facility] [address] [place], [place], [address] Phone: [phone]   Fax:  [phone]  Physical Therapy Treatment  Patient Details  Name: Cynthia PatellaRobin Headen Hendricks MRN: [id number] Date of Birth: 10/30/1965 Referring Provider (PT): Loyal Bubaay Moreira, MD   Encounter Date: 03/08/2019  PT End of Session - 03/08/19 0746    Visit Number  5    Number of Visits  12    Date for PT Re-Evaluation  03/25/19    Authorization Type  BCBS    PT Start Time  0730    PT Stop Time  0825    PT Time Calculation (min)  55 min    Activity Tolerance  Patient tolerated treatment well    Behavior During Therapy  [facility] for tasks assessed/performed       Past Medical History:  Diagnosis Date  . Diabetes mellitus   . Fluid retention   . Fracture of fibula, distal, right, closed 03/05/2012  . Hypertension   . Sleep apnea 2006   tested mod -tried cpap-not using-said better    Past Surgical History:  Procedure Laterality Date  . ABDOMINAL HYSTERECTOMY  2004   abd hyst-lt ovarian cyst  . DIAGNOSTIC LAPAROSCOPY  2008   bilat SO-Adhesions  . DILATION AND CURETTAGE OF UTERUS    . ORIF ANKLE FRACTURE  03/05/2012   Procedure: OPEN REDUCTION INTERNAL FIXATION (ORIF) ANKLE FRACTURE;  Surgeon: Eulas PostJoshua P Landau, MD;  Location: [facility];  Service: Orthopedics;  Laterality: Right;  OPEN TREATMENT BIMALLEOLAR ANKLE INCLUDES INTERNAL FIXATION    There were no vitals filed for this visit.  Subjective Assessment - 03/08/19 0732    Subjective  Better today last week was a bummer. I couldn't get up  form chair. and hard to straighten up x 3 days. Stretched and it got better. Stretching helps most.. Moving and was sitign on floor cross legged ad LBP flared up.    Pain Score  3     Pain Location  Back    Pain Orientation  Right    Pain Descriptors / Indicators  Radiating    Pain Type  Chronic pain    Pain Onset  More than a month ago    Pain Frequency   Constant    Aggravating Factors   ?    Pain Relieving Factors  heat , meds sit erect.                       [facility] Adult PT Treatment/Exercise - 03/08/19 0001      Lumbar Exercises: Stretches   Single Knee to Chest Stretch  Right;Left;30 seconds    Lower Trunk Rotation  2 reps;30 seconds    Lower Trunk Rotation Limitations  RT and LT    Piriformis Stretch  Right;2 reps;30 seconds      Lumbar Exercises: Aerobic   Nustep  L5 5 min UE /LE      Lumbar Exercises: Standing   Row  Both;15 reps    Theraband Level (Row)  Level 3 (Green)    Shoulder Extension  Both;15 reps    Theraband Level (Shoulder Extension)  Level 3 (Green)    Other Standing Lumbar Exercises  shoulder punches with green band x 15 then cross chest LT and RT green band x 12       Lumbar Exercises: Supine   AB Set Limitations  12 reps    Bridge  15 reps    Bridge Limitations  short ROM    Other Supine Lumbar Exercises  ab set with clam shell. x 10       Lumbar Exercises: Sidelying   Clam  Right;Left;15 reps    Clam Limitations  green band              PT Education - 03/08/19 0739    Education Details  Need to be aware of postures and movements that increase pain and to avoid those movements/positions.    Person(s) Educated  Patient    Methods  Explanation    Comprehension  Verbalized understanding       PT Short Term Goals - 03/08/19 0734      PT SHORT TERM GOAL #1   Title  She will be independent with initial HEP    Status  Achieved      PT SHORT TERM GOAL #2   Title  She will report pain decr 25% or more    Baseline  50% better    Status  Achieved      PT SHORT TERM GOAL #3   Title  She will be able to get out of seat a work with 25% decr stiffness/soreness    Status  Achieved        PT Long Term Goals - 03/08/19 0736      PT LONG TERM GOAL #1   Title  She will be independnet with all hEP issued    Status  On-going      PT LONG TERM GOAL #2   Title  she will report  pain as intermittant  and no more than 2/10    Status  On-going      PT LONG TERM GOAL #3   Title  She will report 75% improvement in work and home tasks due to decreased pain    Baseline  50%  but variable    Status  On-going            Plan - 03/08/19 0747    Clinical Impression Statement  Flare up with flexion positioning and twisting.   Talked about avoiding this posture to minimize possiblity of flare up.  Need 4 more visits to expand HEP    Personal Factors and Comorbidities  Behavior Pattern    Stability/Clinical Decision Making  Stable/Uncomplicated    PT Frequency  2x / week    PT Duration  2 weeks    PT Treatment/Interventions  Passive range of motion;Dry needling;Taping;Manual techniques;Patient/family education;Therapeutic exercise;Ultrasound;Moist Heat;Iontophoresis 4mg /ml Dexamethasone;Electrical Stimulation    PT Next Visit Plan  Expand HEP then discharge if no flare ups.    PT Home Exercise Plan  quadrus lumborum stretches, PPT , transverse abdom,  knee to chest, band with shoulder extension , punches and moving hands shoulder to shoulder distance min trunk rotaiton, tennis balls for STW    Consulted and Agree with Plan of Care  Patient       Patient will benefit from skilled therapeutic intervention in order to improve the following deficits and impairments:  Pain, Postural dysfunction, Decreased activity tolerance, Increased muscle spasms, Decreased range of motion  Visit Diagnosis: 1. Chronic right-sided low back pain, unspecified whether sciatica present   2. Muscle spasm of back   3. Abnormal posture        Problem List Patient Active Problem List   Diagnosis Date Noted  . 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 Reduced libido 09/30/2018  . Lumbar radiculopathy 09/01/2018  . Hypercholesterolemia 08/23/2018  . Type  2 diabetes mellitus ([place]) 08/23/2018  . Chlamydial infection 06/12/2017  . 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  Fracture of fibula, distal, right, closed 03/05/2012    Darrel Hoover  PT 03/08/2019, 8:14 AM  [facility] [address] [place], [place], [address] Phone: [phone]   Fax:  [phone]  Name: Cynthia Hendricks MRN: [id number] Date of Birth: [birth year]

## 2019-03-11 ENCOUNTER — Encounter

## 2019-03-18 ENCOUNTER — Ambulatory Visit: Payer: BC Managed Care – PPO | Attending: Internal Medicine | Admitting: Physical Therapy

## 2019-05-05 ENCOUNTER — Other Ambulatory Visit: Payer: Self-pay

## 2019-05-05 ENCOUNTER — Ambulatory Visit: Payer: BC Managed Care – PPO | Attending: Internal Medicine

## 2019-05-05 DIAGNOSIS — R293 Abnormal posture: Secondary | ICD-10-CM | POA: Diagnosis present

## 2019-05-05 DIAGNOSIS — M25521 Pain in right elbow: Secondary | ICD-10-CM

## 2019-05-05 DIAGNOSIS — M25672 Stiffness of left ankle, not elsewhere classified: Secondary | ICD-10-CM

## 2019-05-05 DIAGNOSIS — M25572 Pain in left ankle and joints of left foot: Secondary | ICD-10-CM | POA: Diagnosis present

## 2019-05-05 NOTE — Patient Instructions (Signed)
DF for ankle and gret toe  30 sec x 3  2x/day,   Wrist flexor stretch x 3 30 sec 2-3x/day

## 2019-05-05 NOTE — Therapy (Signed)
[facility] [address] [place], [place], [address] Phone: [phone]   Fax:  [phone]  Physical Therapy Evaluation  Patient Details  Name: Cynthia Hendricks MRN: [id number] Date of Birth: 10/23/1965 Referring Provider (PT): Loyal Bubaay Moreira, MD   Encounter Date: 05/05/2019  PT End of Session - 05/05/19 0714    Visit Number  1    Number of Visits  12    Date for PT Re-Evaluation  06/17/19    Authorization Type  BCBS    PT Start Time  0702    PT Stop Time  0745    PT Time Calculation (min)  43 min    Activity Tolerance  Patient tolerated treatment well    Behavior During Therapy  [employer] for tasks assessed/performed       Past Medical History:  Diagnosis Date  . Diabetes mellitus   . Fluid retention   . Fracture of fibula, distal, right, closed 03/05/2012  . Hypertension   . Sleep apnea 2006   tested mod -tried cpap-not using-said better    Past Surgical History:  Procedure Laterality Date  . ABDOMINAL HYSTERECTOMY  2004   abd hyst-lt ovarian cyst  . DIAGNOSTIC LAPAROSCOPY  2008   bilat SO-Adhesions  . DILATION AND CURETTAGE OF UTERUS    . ORIF ANKLE FRACTURE  03/05/2012   Procedure: OPEN REDUCTION INTERNAL FIXATION (ORIF) ANKLE FRACTURE;  Surgeon: Eulas PostJoshua P Landau, MD;  Location: [facility];  Service: Orthopedics;  Laterality: Right;  OPEN TREATMENT BIMALLEOLAR ANKLE INCLUDES INTERNAL FIXATION    There were no vitals filed for this visit.   Subjective Assessment - 05/05/19 0706    Subjective  She reports RT elbow pain stared with lifting food boxes to bus.  No injry with Lt ankle. She has changed shoes . Pain in PM after being off of feet then walking .    Limitations  Lifting;Standing;Walking    Diagnostic tests  xray ankle with spring.    Patient Stated Goals  decrease pain    Pain Score  0-No pain   pain varies per day can be constant or intermittant, PM pain   Pain Location  Ankle    Pain  Orientation  Left;Anterior;Medial    Pain Descriptors / Indicators  Aching;Sharp    Pain Type  Chronic pain    Pain Onset  More than a month ago    Pain Frequency  Intermittent    Aggravating Factors   weight bearing    Pain Relieving Factors  ice  ROM    Multiple Pain Sites  Yes    Pain Score  0   took ibuprophen last night   Pain Location  Elbow    Pain Orientation  Right    Pain Descriptors / Indicators  Aching;Tender    Pain Type  Acute pain    Pain Onset  1 to 4 weeks ago    Pain Frequency  Intermittent    Aggravating Factors   gripping , lifting    Pain Relieving Factors  meds         [facility] PT Assessment - 05/05/19 0001      Assessment   Medical Diagnosis  left ankle and right elbow pain    Referring Provider (PT)  Loyal Bubaay Moreira, MD    Onset Date/Surgical Date  --   Lenoard Adenamkle 07/2018.  elbow 2 weeks   Next MD Visit  after PT    Prior Therapy  yes for back  Precautions   Precautions  None      Restrictions   Weight Bearing Restrictions  No      Balance Screen   Has the patient fallen in the past 6 months  No      Prior Function   Level of Independence  Independent    Vocation  Full time employment    [employer]      Cognition   Overall Cognitive Status  Within Functional Limits for tasks assessed      Posture/Postural Control   Posture Comments  pronation bilaterally RT > LT       ROM / Strength   AROM / PROM / Strength  AROM;Strength      AROM   AROM Assessment Site  Elbow;Ankle    Right/Left Elbow  Right;Left    Right Elbow Flexion  135    Right Elbow Extension  0    Left Elbow Flexion  135    Left Elbow Extension  0    Right/Left Ankle  Right;Left    Right Ankle Dorsiflexion  95    Right Ankle Plantar Flexion  63    Right Ankle Inversion  40    Right Ankle Eversion  30    Left Ankle Dorsiflexion  85    Left Ankle Plantar Flexion  60    Left Ankle Inversion  38    Left Ankle Eversion  30      PROM   Overall PROM  Comments  tightness slight with DF and posterior glides.    55 great toe DF Lt RT 65       Strength   Overall Strength Comments  Normal UE strength with some mild  pain on resisted wrist extension.       Palpation   Palpation comment  Tender on latreral epicondyle ot RT elbow,                 Objective measurements completed on examination: See above findings.      [facility] Adult PT Treatment/Exercise - 05/05/19 0001      Exercises   Exercises  Elbow;Ankle      Elbow Exercises   Other elbow exercises  wrist flexion stretch 2x30 sec      Ankle Exercises: Stretches   Gastroc Stretch  2 reps;20 seconds    Gastroc Stretch Limitations  t x 1 stand x1    Other Stretch  great toe DF stretch x2 x 30 sec             PT Education - 05/05/19 0750    Education Details  POC HEP    Person(s) Educated  Patient    Methods  Explanation;Demonstration;Tactile cues;Verbal cues;Handout    Comprehension  Verbalized understanding;Returned demonstration       PT Short Term Goals - 05/05/19 0746      PT SHORT TERM GOAL #1   Title  She will be independent with initial HEP    Time  2    Period  Weeks    Status  New      PT SHORT TERM GOAL #2   Title  She will report pain decr 25% or more in LT ankle with walking    Time  3    Period  Weeks    Status  New      PT SHORT TERM GOAL #3   Title  she will report RT elbow pain decreased 25% with lifting at work  Time  3    Period  Weeks    Status  New        PT Long Term Goals - 05/05/19 0747      PT LONG TERM GOAL #1   Title  She will be independnet with all hEP issued    Time  6    Period  Weeks    Status  New      PT LONG TERM GOAL #2   Title  she will report pain as intermittant  and no more than 2/10  Lt ankle with walking during day and  with walking in Pm after sitting    Time  6    Period  Weeks    Status  New      PT LONG TERM GOAL #3   Title  She will report 75% improvement in work and home tasks due to  decreased pain i n RT elbow    Time  6    Period  Weeks    Status  New             Plan - 05/05/19 0741    Clinical Impression Statement  Ms brunell reports back still improved and does her HEP whenit flares.  She has new RT elbow pain from lifting and chronic Lt ankle pain.  Sis tender on later epicondyle  but ROm in normal and strength is normal.   She walks normal today but is tneder medial RT foot/ankle.  she has some mild pain with inverstion testing and PF today. She demo over pronation Lt foot and decreased DF ankle and geat toes on Lt .   She should improve with skilled PT and consistent HEP.    Personal Factors and Comorbidities  Time since onset of injury/illness/exacerbation    Examination-Activity Limitations  Carry;Locomotion Level    Examination-Participation Restrictions  --   lifting at work   Stability/Clinical Decision Making  Stable/Uncomplicated    Clinical Decision Making  Low    Rehab Potential  Good    PT Frequency  2x / week    PT Duration  6 weeks    PT Treatment/Interventions  Passive range of motion;Dry needling;Taping;Manual techniques;Patient/family education;Therapeutic exercise;Ultrasound;Moist Heat;Iontophoresis /ml Dexamethasone;Electrical Stimulation    PT Home Exercise Plan  quadrus lumborum stretches, PPT , transverse abdom,  knee to chest, band with shoulder extension , punches and moving hands shoulder to shoulder distance min trunk rotaiton, tennis balls for STW.  DF stretch ankle/toe,  wrist flexion stretch    Consulted and Agree with Plan of Care  Patient       Patient will benefit from skilled therapeutic intervention in order to improve the following deficits and impairments:  Pain, Postural dysfunction, Decreased activity tolerance, Difficulty walking, Decreased range of motion  Visit Diagnosis: Pain in left ankle and joints of left foot  Pain in right elbow  Stiffness of left ankle joint  Abnormal posture     Problem  List Patient Active Problem List   Diagnosis Date Noted  . 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 Reduced libido 09/30/2018  . Lumbar radiculopathy 09/01/2018  . Hypercholesterolemia 08/23/2018  . Type 2 diabetes mellitus (HCC) 08/23/2018  . Chlamydial infection 06/12/2017  . 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 Fracture of fibula, distal, right, closed 03/05/2012    Caprice Red  PT 05/05/2019, 7:50 AM  [facility] [address] [place], [place], [address] Phone: [phone]  Fax:  [phone]  Name: Cynthia Hendricks MRN: [id number] Date of Birth: 1965-10-12

## 2019-05-09 ENCOUNTER — Other Ambulatory Visit: Payer: Self-pay

## 2019-05-09 ENCOUNTER — Telehealth: Payer: Self-pay | Admitting: *Deleted

## 2019-05-09 ENCOUNTER — Ambulatory Visit (INDEPENDENT_AMBULATORY_CARE_PROVIDER_SITE_OTHER): Payer: BC Managed Care – PPO

## 2019-05-09 ENCOUNTER — Ambulatory Visit: Payer: BC Managed Care – PPO | Admitting: Podiatry

## 2019-05-09 ENCOUNTER — Encounter: Payer: Self-pay | Admitting: Podiatry

## 2019-05-09 VITALS — BP 136/81 | HR 82

## 2019-05-09 DIAGNOSIS — M25472 Effusion, left ankle: Secondary | ICD-10-CM | POA: Diagnosis not present

## 2019-05-09 DIAGNOSIS — T148XXA Other injury of unspecified body region, initial encounter: Secondary | ICD-10-CM | POA: Diagnosis not present

## 2019-05-09 DIAGNOSIS — R2242 Localized swelling, mass and lump, left lower limb: Secondary | ICD-10-CM | POA: Diagnosis not present

## 2019-05-09 DIAGNOSIS — M76829 Posterior tibial tendinitis, unspecified leg: Secondary | ICD-10-CM | POA: Diagnosis not present

## 2019-05-09 NOTE — Telephone Encounter (Signed)
-----   Message from Trula Slade, DPM sent at 05/09/2019  9:04 AM EDT ----- Val- Can you please order an MRi of the left ankle to rule out posterior tibial tendon tear?   Dawn- can you please check orthotic coverage?   Thanks.

## 2019-05-09 NOTE — Telephone Encounter (Signed)
Orders to L. Cox, CMA for pre-cert, faxed to [facility]. 

## 2019-05-09 NOTE — Progress Notes (Signed)
Subjective:   Patient ID: Cynthia Hendricks, female   DOB: 53 y.o.   MRN: [id number]   HPI 53 year old female presents the office with concerns of left ankle, foot pain and swelling.  This is been ongoing since December 2019.  She states that she started get pain to the inside aspect of her ankle pointing to the medial ankle.  She has been seen by orthopedics for this and she recently started physical therapy.  She states that she has not seen any improvement.  She states that the ankle will swell become painful more during the day.  No specific injury.  She not been wearing orthotics for brace.  Last A1c 5.7.  Denies any claudication symptoms.  No history of ulceration.   Review of Systems  All other systems reviewed and are negative.  Past Medical History:  Diagnosis Date  . Diabetes mellitus   . Fluid retention   . Fracture of fibula, distal, right, closed 03/05/2012  . Hypertension   . Sleep apnea 2006   tested mod -tried cpap-not using-said better    Past Surgical History:  Procedure Laterality Date  . ABDOMINAL HYSTERECTOMY  2004   abd hyst-lt ovarian cyst  . DIAGNOSTIC LAPAROSCOPY  2008   bilat SO-Adhesions  . DILATION AND CURETTAGE OF UTERUS    . ORIF ANKLE FRACTURE  03/05/2012   Procedure: OPEN REDUCTION INTERNAL FIXATION (ORIF) ANKLE FRACTURE;  Surgeon: Eulas Post, MD;  Location: [facility];  Service: Orthopedics;  Laterality: Right;  OPEN TREATMENT BIMALLEOLAR ANKLE INCLUDES INTERNAL FIXATION     Current Outpatient Medications:  .  glucose blood (EMBRACE BLOOD GLUCOSE TEST) test strip, , Disp: , Rfl:  .  metFORMIN (GLUCOPHAGE) 500 MG tablet, Take 500 mg by mouth daily with breakfast., Disp: , Rfl:  .  nabumetone (RELAFEN) 750 MG tablet, Take 1 tablet (750 mg total) by mouth 2 (two) times daily as needed., Disp: 60 tablet, Rfl: 6 .  rosuvastatin (CRESTOR) 10 MG tablet, , Disp: , Rfl:   No Known Allergies       Objective:  Physical Exam   General: AAO x3, NAD  Dermatological: Skin is warm, dry and supple bilateral. Nails x 10 are well manicured; remaining integument appears unremarkable at this time. There are no open sores, no preulcerative lesions, no rash or signs of infection present.  Vascular: Dorsalis Pedis artery and Posterior Tibial artery pedal pulses are 2/4 bilateral with immedate capillary fill time. Pedal hair growth present. There is no pain with calf compression, swelling, warmth, erythema.   Neruologic: Grossly intact via light touch bilateral. Vibratory intact via tuning fork bilateral. Protective threshold with Semmes Wienstein monofilament intact to all pedal sites bilateral.  Negative Tinel sign.  Musculoskeletal: There is tenderness on the course of the posterior tibial tendon just posterior and inferior to the medial malleolus.  Overall the tendon appears to be intact.  She is able to do a single and double heel rise however it is tender to perform a single heel rise.  Muscular strength 5/5 in all groups tested bilateral.  Flatfoot deformities present.  Gait: Unassisted, Nonantalgic.      Assessment:   Left posterior tibial tendinitis, rule out tear     Plan:  -Treatment options discussed including all alternatives, risks, and complications -Etiology of symptoms were discussed -X-rays were obtained and reviewed with the patient.  Flatfoot deformities present.  No evidence of acute fracture or stress fracture. -Tri-Lock ankle brace dispensed -MRI ordered to  rule out partial tear of the posterior tibial tendon. -For now we will hold physical therapy to get the MRI results.  I want her to continue ankle brace.  Anti-inflammatories as needed.  Ice.  Once the MRI comes back we will determine further treatment.   No follow-ups on file.  Trula Slade DPM

## 2019-05-10 ENCOUNTER — Telehealth: Payer: Self-pay | Admitting: *Deleted

## 2019-05-10 NOTE — Telephone Encounter (Signed)
[place] and spoke with Herbie Baltimore and got the authorization after Dr Jacqualyn Posey did a peer to peer with the on site Doctor and the authorization number is [id number] and is valid 05-10-2019 thru 11-05-2019. Lattie Haw

## 2019-05-12 ENCOUNTER — Other Ambulatory Visit: Payer: Self-pay | Admitting: Emergency Medicine

## 2019-05-12 DIAGNOSIS — Z20822 Contact with and (suspected) exposure to covid-19: Secondary | ICD-10-CM

## 2019-05-13 LAB — NOVEL CORONAVIRUS, NAA: SARS-CoV-2, NAA: NOT DETECTED

## 2019-05-16 ENCOUNTER — Encounter (INDEPENDENT_AMBULATORY_CARE_PROVIDER_SITE_OTHER): Payer: BC Managed Care – PPO | Admitting: Ophthalmology

## 2019-05-16 ENCOUNTER — Other Ambulatory Visit: Payer: BC Managed Care – PPO | Admitting: Orthotics

## 2019-05-16 ENCOUNTER — Other Ambulatory Visit: Payer: Self-pay

## 2019-05-16 DIAGNOSIS — H33302 Unspecified retinal break, left eye: Secondary | ICD-10-CM

## 2019-05-16 DIAGNOSIS — H43813 Vitreous degeneration, bilateral: Secondary | ICD-10-CM | POA: Diagnosis not present

## 2019-05-17 ENCOUNTER — Other Ambulatory Visit: Payer: Self-pay

## 2019-05-17 ENCOUNTER — Ambulatory Visit: Payer: BC Managed Care – PPO | Attending: Internal Medicine | Admitting: Physical Therapy

## 2019-05-17 DIAGNOSIS — M25521 Pain in right elbow: Secondary | ICD-10-CM | POA: Diagnosis present

## 2019-05-17 DIAGNOSIS — M25672 Stiffness of left ankle, not elsewhere classified: Secondary | ICD-10-CM

## 2019-05-17 DIAGNOSIS — M25572 Pain in left ankle and joints of left foot: Secondary | ICD-10-CM | POA: Diagnosis present

## 2019-05-17 DIAGNOSIS — R293 Abnormal posture: Secondary | ICD-10-CM | POA: Diagnosis present

## 2019-05-17 NOTE — Therapy (Addendum)
[place], [place], [address] Phone: [phone]   Fax:  [phone]  Physical Therapy Treatment/Discharge  Patient Details  Name: Genice Kimberlin MRN: [id number] Date of Birth: 09-30-1965 Referring Provider (PT): Norina Buzzard, MD   Encounter Date: 05/17/2019  PT End of Session - 05/17/19 0717    Visit Number  2    Number of Visits  12    Date for PT Re-Evaluation  06/17/19    Authorization Type  BCBS    PT Start Time  0715    PT Stop Time  0754    PT Time Calculation (min)  39 min       Past Medical History:  Diagnosis Date  . Diabetes mellitus   . Fluid retention   . Fracture of fibula, distal, right, closed 03/05/2012  . Hypertension   . Sleep apnea 2006   tested mod -tried cpap-not using-said better    Past Surgical History:  Procedure Laterality Date  . ABDOMINAL HYSTERECTOMY  2004   abd hyst-lt ovarian cyst  . DIAGNOSTIC LAPAROSCOPY  2008   bilat SO-Adhesions  . DILATION AND CURETTAGE OF UTERUS    . ORIF ANKLE FRACTURE  03/05/2012   Procedure: OPEN REDUCTION INTERNAL FIXATION (ORIF) ANKLE FRACTURE;  Surgeon: Johnny Bridge, MD;  Location: [facility];  Service: Orthopedics;  Laterality: Right;  OPEN TREATMENT BIMALLEOLAR ANKLE INCLUDES INTERNAL FIXATION    There were no vitals filed for this visit.  Subjective Assessment - 05/17/19 0717    Subjective  Podiatrist recommended I hold PT for now on the foot. I think I need to rest it but I never get a day off.    Currently in Pain?  Yes    Pain Score  0-No pain    Pain Location  Ankle    Pain Score  6    Pain Location  Elbow    Pain Orientation  Right    Pain Descriptors / Indicators  Aching    Aggravating Factors   gripping, lifting    Pain Relieving Factors  meds, cream                       [facility] Adult PT Treatment/Exercise - 05/17/19 0001      Elbow Exercises   Elbow Flexion Limitations   velcro pad x 3    Elbow Extension  15 reps    Bar Weights/Barbell (Elbow Extension)  1 lb    Elbow Extension Limitations  velcro pad x 3    Forearm Supination Limitations  velcro pad x 3    Forearm Pronation Limitations  velcro pad x3    Other elbow exercises  wrist flexion stretch 2x30 sec    Other elbow exercises  UBE L1 2 min each way      Modalities   Modalities  Ultrasound      Ultrasound   Ultrasound Location  Right Lateral Elbow    Ultrasound Parameters  pulsed 23mz .8w/cm2 x 8 min    Ultrasound Goals  Pain      Manual Therapy   Manual Therapy  Edema management;Soft tissue mobilization    Edema Management  Ice massage x 3 minutes lateral elbow     Soft tissue mobilization  Lateral elbow and wrist wrist extensors                PT Short Term Goals - 05/05/19 0746      PT  SHORT TERM GOAL #1   Title  She will be independent with initial HEP    Time  2    Period  Weeks    Status  New      PT SHORT TERM GOAL #2   Title  She will report pain decr 25% or more in LT ankle with walking    Time  3    Period  Weeks    Status  New      PT SHORT TERM GOAL #3   Title  she will report RT elbow pain decreased 25% with lifting at work    Time  3    Period  Weeks    Status  New        PT Long Term Goals - 05/05/19 0747      PT LONG TERM GOAL #1   Title  She will be independnet with all hEP issued    Time  6    Period  Weeks    Status  New      PT LONG TERM GOAL #2   Title  she will report pain as intermittant  and no more than 2/10  Lt ankle with walking during day and  with walking in Pm after sitting    Time  6    Period  Weeks    Status  New      PT LONG TERM GOAL #3   Title  She will report 75% improvement in work and home tasks due to decreased pain i n RT elbow    Time  6    Period  Weeks    Status  New            Plan - 05/17/19 0719    Clinical Impression Statement  Pt reports lifting is aggravating her pain everday at work. Began  wrist/elbow strengthening. Modalities and manual work used to decrease pain and inflammation. Instructed pt in self ice massage. She had some increased soreness during strengthening. Post session she was numb from ice massage.    PT Next Visit Plan  assess benefit of manual and modalities, progressive elbow/wrist strength and endurance. [place] if signed    PT Home Exercise Plan  quadrus lumborum stretches, PPT , transverse abdom,  knee to chest, band with shoulder extension , punches and moving hands shoulder to shoulder distance min trunk rotaiton, tennis balls for STW.  DF stretch ankle/toe,  wrist flexion stretch       Patient will benefit from skilled therapeutic intervention in order to improve the following deficits and impairments:  Pain, Postural dysfunction, Decreased activity tolerance, Difficulty walking, Decreased range of motion  Visit Diagnosis: Pain in left ankle and joints of left foot  Pain in right elbow  Stiffness of left ankle joint  Abnormal posture     Problem List Patient Active Problem List   Diagnosis Date Noted  . 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 Reduced libido 09/30/2018  . Lumbar radiculopathy 09/01/2018  . Hypercholesterolemia 08/23/2018  . Type 2 diabetes mellitus ([place]) 08/23/2018  . Chlamydial infection 06/12/2017  . 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 Fracture of fibula, distal, right, closed 03/05/2012    Dorene Ar, PTA 05/17/2019, 8:07 AM  [place], [place], [address] Phone: [phone]   Fax:  [phone]  Name: Earleen Aoun MRN: [id number] Date of Birth: 03-14-1966  PHYSICAL THERAPY DISCHARGE SUMMARY  Visits from Start of  Care: 2  Current functional level related to goals / functional outcomes: Unknown as she canceled her appointments per her provider and did not return   Remaining deficits: Unknown   Education /  Equipment: HEP  Plan: Patient agrees to discharge.  Patient goals were not met. Patient is being discharged due to the physician's request.  ?????    Pearson Forster PT     07/19/19

## 2019-05-20 ENCOUNTER — Encounter: Payer: BC Managed Care – PPO | Admitting: Physical Therapy

## 2019-05-22 ENCOUNTER — Ambulatory Visit
Admission: RE | Admit: 2019-05-22 | Discharge: 2019-05-22 | Disposition: A | Discharge status: Home / Self Care | Payer: BC Managed Care – PPO | Source: Ambulatory Visit | Attending: Podiatry | Admitting: Podiatry

## 2019-05-22 ENCOUNTER — Other Ambulatory Visit: Payer: Self-pay

## 2019-05-24 ENCOUNTER — Encounter: Payer: BC Managed Care – PPO | Admitting: Physical Therapy

## 2019-05-24 ENCOUNTER — Telehealth: Payer: Self-pay | Admitting: *Deleted

## 2019-05-24 NOTE — Telephone Encounter (Signed)
I spoke with pt on her home phone and informed of Dr. Leigh Aurora review of MRI results and to go in to a CAM boot and make an earlier appt to be seen. Pt states she has a Boot, and I instructed her to go in to the boot now, and I transferred to scheduler.

## 2019-05-24 NOTE — Telephone Encounter (Signed)
-----   Message from Trula Slade, DPM sent at 05/23/2019  9:06 AM EDT ----- Val- please let her know that the MRI is showing that the tendon I was concerned about is completely ruptured. She needs to go into a CAM Boot. This is likely chronic at this point. She needs an appointment to come in to be seen soon to discuss.

## 2019-05-24 NOTE — Telephone Encounter (Signed)
Left message on pt's mobile phone, informing pt of Dr. Leigh Aurora review of MRI results and due to the importance of getting pt in a CAM boot to stabilize and to get an appt.

## 2019-05-26 ENCOUNTER — Encounter: Payer: BC Managed Care – PPO | Admitting: Physical Therapy

## 2019-05-30 ENCOUNTER — Other Ambulatory Visit: Payer: Self-pay

## 2019-05-30 ENCOUNTER — Encounter (INDEPENDENT_AMBULATORY_CARE_PROVIDER_SITE_OTHER): Payer: BC Managed Care – PPO | Admitting: Ophthalmology

## 2019-05-30 DIAGNOSIS — H33302 Unspecified retinal break, left eye: Secondary | ICD-10-CM

## 2019-06-03 ENCOUNTER — Encounter: Payer: BC Managed Care – PPO | Admitting: Physical Therapy

## 2019-06-07 ENCOUNTER — Other Ambulatory Visit: Payer: Self-pay

## 2019-06-07 ENCOUNTER — Encounter: Payer: Self-pay | Admitting: Podiatry

## 2019-06-07 ENCOUNTER — Ambulatory Visit: Payer: BC Managed Care – PPO | Admitting: Podiatry

## 2019-06-07 DIAGNOSIS — M76829 Posterior tibial tendinitis, unspecified leg: Secondary | ICD-10-CM | POA: Diagnosis not present

## 2019-06-07 DIAGNOSIS — M66872 Spontaneous rupture of other tendons, left ankle and foot: Secondary | ICD-10-CM

## 2019-06-08 ENCOUNTER — Telehealth: Payer: Self-pay | Admitting: Podiatry

## 2019-06-08 NOTE — Telephone Encounter (Signed)
I'm returning your call.  How can I help you?  "I saw Dr. Jacqualyn Posey yesterday.  He told me I needed surgery.  How long can I hold off on having this surgery?"  You have a ruptured tendon so you need to do it as soon as possible.  "I'm out of work right now on [employer] for an injury that I had.  Should I wait until my Worker's Comp is over?  My injury is doing better so I should be going back to work soon."  You're going to be out of work again for about four to six weeks.  "Okay, I'll call and schedule the surgery in two weeks.  I need to get my FMLA paperwork together."

## 2019-06-08 NOTE — Telephone Encounter (Signed)
Cynthia Hendricks called to see if someone could give her a call back about her foot surgery that Dr Jacqualyn Posey suggested she wants a lil more questions answered if possibl

## 2019-06-11 NOTE — Progress Notes (Signed)
Subjective: 53 year old female presents the office to discuss MRI results.  Since the MRI she has stopped physical therapy and she is been using a small cam boot that she has at home which is requesting a new one today.  She presents today also for further discussion regards the MRI and possible surgical planning.  This is been an ongoing issue for over a year to her left ankle.  Despite physical therapy as well as immobilization she has continued to have discomfort. Denies any systemic complaints such as fevers, chills, nausea, vomiting. No acute changes since last appointment, and no other complaints at this time.   Objective: AAO x3, NAD DP/PT pulses palpable bilaterally, CRT less than 3 seconds There is still edema as well as tenderness noted on the medial aspect and below the course of the flexor tendons.  Strength appears to be intact with minimal decrease in plantarflexion inversion.  No pain with Achilles tendon there is no other areas of tenderness identified at this time.  Flatfoot is present.  No significant pain to the lateral ankle.  No pain with calf compression, swelling, warmth, erythema  MRI 05/22/2019: IMPRESSION: 1. Ruptured tibialis posterior tendon with a 5.2 cm fluid-filled gap between the thickened distal margin of the proximal segment and the thickened distal segment. Associated tenosynovitis of the tibialis posterior and adjacent flexor digitorum longus and also the flexor hallucis longus tendons. 2. Attenuated ATFL, likely previously torn. 3. Thickened appearance of the superomedial and medioplantar oblique components of the spring ligament, as well as the tibiospring portion of the deltoid ligament. 4. Patchy indistinct foci of accentuated T2 signal possibly from osteoporosis of disuse or low-grade stress reaction.   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  -I reviewed the MRI with her we long discussion regards to treatment options.  I do recommend surgical intervention given the ruptured tendon.  I discussed with her FDL transfer.  We discussed the surgery goals the postoperative course.  Alternatives, risks, complications discussed with patient.  She wants to go home discussed with her husband and she is in a call the office she would like to have surgery.  For now continue mobilization in the cam boot I dispensed a new cam boot today at her request.  Ice elevation. -Patient encouraged to call the office with any questions, concerns, change in symptoms.

## 2019-06-15 ENCOUNTER — Telehealth: Payer: Self-pay | Admitting: *Deleted

## 2019-06-15 NOTE — Telephone Encounter (Signed)
"  I'd like you to give me a call.  This is in regards to scheduling surgery on my left foot.  My doctor is Dr. Jacqualyn Posey.  If you could, give me a call back."

## 2019-06-20 ENCOUNTER — Other Ambulatory Visit: Payer: Self-pay

## 2019-06-20 ENCOUNTER — Ambulatory Visit: Payer: BC Managed Care – PPO | Admitting: Podiatry

## 2019-06-20 DIAGNOSIS — M66872 Spontaneous rupture of other tendons, left ankle and foot: Secondary | ICD-10-CM | POA: Diagnosis not present

## 2019-06-20 DIAGNOSIS — M76829 Posterior tibial tendinitis, unspecified leg: Secondary | ICD-10-CM

## 2019-06-20 NOTE — Telephone Encounter (Signed)
Cynthia Hendricks came in for a consultation today.  Her surgery will be scheduled for 07/13/2019.

## 2019-06-20 NOTE — Progress Notes (Signed)
Subjective: 53 year old female presents the office today for surgical consultation due to a ruptured posterior tibial tendon. She presents today with her husband to further discuss with surgery well.  She still getting tenderness on the medial aspect ankle but she points to.Denies any systemic complaints such as fevers, chills, nausea, vomiting. No acute changes since last appointment, and no other complaints at this time.   Objective: AAO x3, NAD DP/PT pulses palpable bilaterally, CRT less than 3 seconds Tenderness on the course of the flexor tendons, posterior tibial tendon.  Has chronic edema to this area.  There is no tenderness metatarsals foot, lateral ankle.  No pain with Achilles tendon.  Does have some weakness in plantarflexion, inversion. No pain with calf compression, swelling, warmth, erythema  Assessment: Posterior tibial tendon rupture  Plan: -All treatment options discussed with the patient including all alternatives, risks, complications.  -I did review the MRI with her.  Discussed both conservative as well as surgical treatment options.  At this time I do recommend surgical intervention.  Discussed trying to repair the posterior tibial tendon although unlikely.  Discussed with her FDL tendon transfer. -The incision placement as well as the postoperative course was discussed with the patient. I discussed risks of the surgery which include, but not limited to, infection, bleeding, pain, swelling, need for further surgery, delayed or nonhealing, painful or ugly scar, numbness or sensation changes, over/under correction, recurrence, transfer lesions, further deformity, hardware failure, DVT/PE, loss of toe/foot. Patient understands these risks and wishes to proceed with surgery. The surgical consent was reviewed with the patient all 3 pages were signed. No promises or guarantees were given to the outcome of the procedure. All questions were answered to the best of my ability. Before the  surgery the patient was encouraged to call the office if there is any further questions. The surgery will be performed at the [facility] on an outpatient basis. -Patient encouraged to call the office with any questions, concerns, change in symptoms.   Trula Slade DPM

## 2019-06-20 NOTE — Patient Instructions (Signed)

## 2019-06-23 ENCOUNTER — Other Ambulatory Visit: Payer: Self-pay | Admitting: *Deleted

## 2019-06-23 DIAGNOSIS — M76829 Posterior tibial tendinitis, unspecified leg: Secondary | ICD-10-CM

## 2019-06-23 DIAGNOSIS — M76822 Posterior tibial tendinitis, left leg: Secondary | ICD-10-CM

## 2019-06-23 DIAGNOSIS — M66872 Spontaneous rupture of other tendons, left ankle and foot: Secondary | ICD-10-CM

## 2019-06-23 NOTE — Progress Notes (Signed)
Per Dr. Jacqualyn Posey, I sent a request to Patsy Baltimore with [place] and [facility], for assistance in the care of the patient for [place], Physical Therapy and to assist in getting a knee scooter for the patient.

## 2019-07-04 ENCOUNTER — Other Ambulatory Visit: Payer: Self-pay | Admitting: Internal Medicine

## 2019-07-04 DIAGNOSIS — M79676 Pain in unspecified toe(s): Secondary | ICD-10-CM

## 2019-07-04 DIAGNOSIS — Z1231 Encounter for screening mammogram for malignant neoplasm of breast: Secondary | ICD-10-CM

## 2019-07-05 ENCOUNTER — Telehealth: Payer: Self-pay | Admitting: *Deleted

## 2019-07-05 NOTE — Telephone Encounter (Signed)
DOS 07/12/1966 TENDON TRANSFER - 77116 AND REPAIR POST. TIBIAL TENDON - 28200 LEFT FOOT  BCBS: Eligibility Date - 08/11/2018 - 08/10/9998   In-Network    Max Per Benefit Period Year-to-Date Remaining  CoInsurance  20%   Deductible  $1250.00 $0.00  Out-Of-Pocket  $4890.00 $2480.44   AMBULATORY SURGERY  In Network  [employer] Coinsurance  Not Applicable  57% per [place]

## 2019-07-13 ENCOUNTER — Other Ambulatory Visit: Payer: Self-pay | Admitting: Podiatry

## 2019-07-13 ENCOUNTER — Encounter: Payer: Self-pay | Admitting: Podiatry

## 2019-07-13 DIAGNOSIS — M66872 Spontaneous rupture of other tendons, left ankle and foot: Secondary | ICD-10-CM

## 2019-07-13 MED ORDER — PROMETHAZINE HCL 25 MG PO TABS: 25.0000 mg | ORAL_TABLET | Freq: Three times a day (TID) | ORAL | 0 refills | Status: DC | PRN

## 2019-07-13 MED ORDER — OXYCODONE-ACETAMINOPHEN 5-325 MG PO TABS: 1.0000 | ORAL_TABLET | Freq: Four times a day (QID) | ORAL | 0 refills | Status: DC | PRN

## 2019-07-13 MED ORDER — CEPHALEXIN 500 MG PO CAPS: 500.0000 mg | ORAL_CAPSULE | Freq: Three times a day (TID) | ORAL | 0 refills | Status: DC

## 2019-07-13 NOTE — Progress Notes (Signed)
Post op medications sent to pharmacy 

## 2019-07-15 ENCOUNTER — Telehealth: Payer: Self-pay | Admitting: *Deleted

## 2019-07-15 NOTE — Telephone Encounter (Signed)
[name]

## 2019-07-21 ENCOUNTER — Ambulatory Visit (INDEPENDENT_AMBULATORY_CARE_PROVIDER_SITE_OTHER): Payer: BC Managed Care – PPO

## 2019-07-21 ENCOUNTER — Other Ambulatory Visit: Payer: Self-pay

## 2019-07-21 ENCOUNTER — Ambulatory Visit (INDEPENDENT_AMBULATORY_CARE_PROVIDER_SITE_OTHER): Payer: BC Managed Care – PPO | Admitting: Podiatry

## 2019-07-21 DIAGNOSIS — M76822 Posterior tibial tendinitis, left leg: Secondary | ICD-10-CM

## 2019-07-21 DIAGNOSIS — M66872 Spontaneous rupture of other tendons, left ankle and foot: Secondary | ICD-10-CM | POA: Diagnosis not present

## 2019-07-21 MED ORDER — IBUPROFEN 800 MG PO TABS: 800.0000 mg | ORAL_TABLET | Freq: Three times a day (TID) | ORAL | 0 refills | Status: DC | PRN

## 2019-07-28 ENCOUNTER — Ambulatory Visit (INDEPENDENT_AMBULATORY_CARE_PROVIDER_SITE_OTHER): Payer: BC Managed Care – PPO | Admitting: Podiatry

## 2019-07-28 ENCOUNTER — Other Ambulatory Visit: Payer: Self-pay

## 2019-07-28 DIAGNOSIS — M66872 Spontaneous rupture of other tendons, left ankle and foot: Secondary | ICD-10-CM

## 2019-07-28 DIAGNOSIS — M76822 Posterior tibial tendinitis, left leg: Secondary | ICD-10-CM

## 2019-07-31 NOTE — Progress Notes (Signed)
Subjective: Cynthia Hendricks is a 53 y.o. is seen today in office s/p left FDL transfer due to ruptured posterior tibial tendon.  She states that she is doing better.  The pain is better controlled as well.  She denies any recent injury or falls.  She presents here for cast change as well as possible suture removal. Denies any systemic complaints such as fevers, chills, nausea, vomiting. No calf pain, chest pain, shortness of breath.   Objective: General: No acute distress, AAOx3  DP/PT pulses palpable 2/4, CRT < 3 sec to all digits.  Protective sensation intact. Motor function intact.  LEFT foot: Incision is well coapted without any evidence of dehiscence with sutures and staples intact. There is no surrounding erythema, ascending cellulitis, fluctuance, crepitus, malodor, drainage/purulence. There is mildly decreased edema around the surgical site. There is mild pain along the surgical site.  Incisions healing well with any signs of infection.  She is able to move her ankle better today otherwise some stiffness last week without any pain. No other areas of tenderness to bilateral lower extremities.  No other open lesions or pre-ulcerative lesions.  No pain with calf compression, swelling, warmth, erythema.   Assessment and Plan:  Status post left FDL transfer, doing well with no complications   -Treatment options discussed including all alternatives, risks, and complications -Cast was changed today.  I removed the sutures but left the staples intact.  Antibiotic ointment and a dressing applied.  Next a well-padded below-knee fiberglass cast was applied making sure to pad all bony prominences. -ASA 81mg  daily -Ice/elevation -Pain medication as needed. -Monitor for any clinical signs or symptoms of infection and DVT/PE and directed to call the office immediately should any occur or go to the ER. -Follow-up as scheduled possible staple removal and cast application or sooner if any problems  arise. In the meantime, encouraged to call the office with any questions, concerns, change in symptoms.   Celesta Gentile, DPM

## 2019-07-31 NOTE — Progress Notes (Signed)
Subjective: Chloey Ricard is a 53 y.o. is seen today in office s/p left FDL transfer due to ruptured posterior tibial tendon overall she states that she is doing well.  The pain is controlled but she still taking pain medication intermittently.  She presents here also for cast application.  Denies any systemic complaints such as fevers, chills, nausea, vomiting. No calf pain, chest pain, shortness of breath.   Objective: General: No acute distress, AAOx3  DP/PT pulses palpable 2/4, CRT < 3 sec to all digits.  Protective sensation intact. Motor function intact.  LEFT foot: Incision is well coapted without any evidence of dehiscence with sutures and staples intact. There is no surrounding erythema, ascending cellulitis, fluctuance, crepitus, malodor, drainage/purulence. There is mild edema around the surgical site. There is mild pain along the surgical site.  Incisions healing well with any signs of infection. No other areas of tenderness to bilateral lower extremities.  No other open lesions or pre-ulcerative lesions.  No pain with calf compression, swelling, warmth, erythema.   Assessment and Plan:  Status post left FDL transfer, doing well with no complications   -Treatment options discussed including all alternatives, risks, and complications -X-rays obtained and reviewed.  Hardware intact to the navicular. -Incisions healing well.  Antibiotic ointment and a dressing applied.  A well-padded below-knee fiberglass cast was applied making sure to pad all bony prominences. -ASA 81mg  daily -Ice/elevation -Pain medication as needed. -Monitor for any clinical signs or symptoms of infection and DVT/PE and directed to call the office immediately should any occur or go to the ER. -Follow-up as scheduled possible suture removal and cast application or sooner if any problems arise. In the meantime, encouraged to call the office with any questions, concerns, change in symptoms.   Celesta Gentile,  DPM

## 2019-08-01 ENCOUNTER — Encounter: Payer: Self-pay | Admitting: Podiatry

## 2019-08-11 ENCOUNTER — Encounter: Payer: Self-pay | Admitting: Podiatry

## 2019-08-11 ENCOUNTER — Ambulatory Visit (INDEPENDENT_AMBULATORY_CARE_PROVIDER_SITE_OTHER): Payer: BC Managed Care – PPO | Admitting: Podiatry

## 2019-08-11 ENCOUNTER — Other Ambulatory Visit: Payer: Self-pay

## 2019-08-11 DIAGNOSIS — M66872 Spontaneous rupture of other tendons, left ankle and foot: Secondary | ICD-10-CM

## 2019-08-11 DIAGNOSIS — M76822 Posterior tibial tendinitis, left leg: Secondary | ICD-10-CM

## 2019-08-11 NOTE — Progress Notes (Signed)
Subjective: Cynthia Hendricks is a 53 y.o. is seen today in office s/p left FDL transfer due to ruptured posterior tibial tendon.  She presents today for staple removal as well as cast change.  She has been doing well and she is not taking pain medication.  She been icing elevating.  No recent injury or falls and she has no other concerns today. Denies any systemic complaints such as fevers, chills, nausea, vomiting. No calf pain, chest pain, shortness of breath.   Objective: General: No acute distress, AAOx3  DP/PT pulses palpable 2/4, CRT < 3 sec to all digits.  Protective sensation intact. Motor function intact.  LEFT foot: Incision is well coapted without any evidence of dehiscence with staples intact.  There is minimal edema there is no erythema or warmth but there is no drainage or pus the incision appears to be healing well.  No significant discomfort to palpation on the surgical site.  No other areas of tenderness.  Flexor, extensor tendons appear to be intact. No other open lesions or pre-ulcerative lesions.  No pain with calf compression, swelling, warmth, erythema.   Assessment and Plan:  Status post left FDL transfer, doing well with no complications   -Treatment options discussed including all alternatives, risks, and complications -Staples removed today and incision was healing well.  Steri-Strips applied for reinforcement followed by antibiotic ointment and a dressing.  A well-padded below-knee fiberglass cast was applied making sure to pad all bony prominences. -ASA 81mg  daily -Ice/elevation -Pain medication as needed. -Monitor for any clinical signs or symptoms of infection and DVT/PE and directed to call the office immediately should any occur or go to the ER. -Follow-up as scheduled for cast change or sooner if any problems arise. In the meantime, encouraged to call the office with any questions, concerns, change in symptoms.   Celesta Gentile, DPM

## 2019-08-12 HISTORY — PX: OTHER SURGICAL HISTORY: SHX169

## 2019-08-25 ENCOUNTER — Encounter: Payer: BC Managed Care – PPO | Admitting: Podiatry

## 2019-08-25 ENCOUNTER — Other Ambulatory Visit: Payer: Self-pay

## 2019-08-25 ENCOUNTER — Ambulatory Visit (INDEPENDENT_AMBULATORY_CARE_PROVIDER_SITE_OTHER): Payer: BC Managed Care – PPO | Admitting: Podiatry

## 2019-08-25 DIAGNOSIS — M66872 Spontaneous rupture of other tendons, left ankle and foot: Secondary | ICD-10-CM

## 2019-09-01 NOTE — Progress Notes (Signed)
Subjective: Cynthia Hendricks is a 54 y.o. is seen today in office s/p left FDL transfer due to ruptured posterior tibial tendon.  She said that she is feeling well and her pain is controlled.  She denies any recent injury or falls.  She has no new concerns.  Denies any fevers, chills, nausea, vomiting.  No calf pain, chest pain, shortness of breath.   Objective: General: No acute distress, AAOx3  DP/PT pulses palpable 2/4, CRT < 3 sec to all digits.  Protective sensation intact. Motor function intact.  LEFT foot: Incision is well coapted without any evidence of dehiscence.  There is no surrounding erythema, drainage or pus or any clinical signs of infection.  Scab is presently incision.  Minimal discomfort to the surgical site.  She is able to put her ankle through range of motion but any significant pain.  No other open lesions or pre-ulcerative lesions.  No pain with calf compression, swelling, warmth, erythema.   Assessment and Plan:  Status post left FDL transfer, doing well with no complications   -Treatment options discussed including all alternatives, risks, and complications -Incision is healing well without signs of infection.  To transition to a cam boot today we discussed range of motion exercises that she can start on her own.  Also she can start to get the foot wet and apply a small amount of antibiotic ointment to the incision daily.  Continue ice elevate. -ASA 81mg  daily -Ice/elevation -Pain medication as needed. -Monitor for any clinical signs or symptoms of infection and DVT/PE and directed to call the office immediately should any occur or go to the ER.  Return in about 2 weeks (around 09/08/2019). Will likely start formal PT next appointment   09/10/2019, DPM

## 2019-09-08 ENCOUNTER — Encounter: Payer: Self-pay | Admitting: Podiatry

## 2019-09-08 ENCOUNTER — Ambulatory Visit (INDEPENDENT_AMBULATORY_CARE_PROVIDER_SITE_OTHER): Payer: BC Managed Care – PPO | Admitting: Podiatry

## 2019-09-08 ENCOUNTER — Ambulatory Visit (INDEPENDENT_AMBULATORY_CARE_PROVIDER_SITE_OTHER): Payer: BC Managed Care – PPO

## 2019-09-08 ENCOUNTER — Other Ambulatory Visit: Payer: Self-pay

## 2019-09-08 DIAGNOSIS — M66872 Spontaneous rupture of other tendons, left ankle and foot: Secondary | ICD-10-CM

## 2019-09-08 DIAGNOSIS — M76822 Posterior tibial tendinitis, left leg: Secondary | ICD-10-CM

## 2019-09-08 NOTE — Progress Notes (Signed)
Subjective: Cynthia Hendricks is a 54 y.o. is seen today in office s/p left FDL transfer due to ruptured posterior tibial tendon.  Overall she states that she is doing well.  Has been doing range of motion exercises at home and she only gets some discomfort she turns her ankle a certain position.  She is been icing elevating.  Denies any recent injury or falls.  Minimal swelling.  She has no fevers, chills, nausea, vomiting.  No calf pain, chest pain, shortness of breath.  Objective: General: No acute distress, AAOx3  DP/PT pulses palpable 2/4, CRT < 3 sec to all digits.  Protective sensation intact. Motor function intact.  LEFT foot: Incision is well coapted without any evidence of dehiscence and a scar has formed.  There is no surrounding erythema, drainage or pus or any clinical signs of infection.   No significant discomfort to the surgical site.  She is able to put her ankle through range of motion but any significant pain.  No other open lesions or pre-ulcerative lesions.  No pain with calf compression, swelling, warmth, erythema.   Assessment and Plan:  Status post left FDL transfer, doing well with no complications   -Treatment options discussed including all alternatives, risks, and complications -Overall she is doing better.  I discussed with her starting to transition to partial weightbearing as tolerated p.o. can start formal physical therapy.  Prescription for [facility] was written today.  Continue ice and elevate.  Return in about 3 weeks (around 09/29/2019).  Vivi Barrack DPM

## 2019-09-22 ENCOUNTER — Ambulatory Visit: Payer: BC Managed Care – PPO | Attending: Family

## 2019-09-22 ENCOUNTER — Other Ambulatory Visit: Payer: Self-pay

## 2019-09-22 DIAGNOSIS — Z20822 Contact with and (suspected) exposure to covid-19: Secondary | ICD-10-CM

## 2019-09-23 LAB — NOVEL CORONAVIRUS, NAA: SARS-CoV-2, NAA: NOT DETECTED

## 2019-09-29 ENCOUNTER — Encounter: Payer: BC Managed Care – PPO | Admitting: Podiatry

## 2019-10-04 ENCOUNTER — Other Ambulatory Visit: Payer: Self-pay

## 2019-10-04 ENCOUNTER — Encounter: Payer: Self-pay | Admitting: Podiatry

## 2019-10-04 ENCOUNTER — Ambulatory Visit (INDEPENDENT_AMBULATORY_CARE_PROVIDER_SITE_OTHER): Payer: BC Managed Care – PPO | Admitting: Podiatry

## 2019-10-04 ENCOUNTER — Encounter (INDEPENDENT_AMBULATORY_CARE_PROVIDER_SITE_OTHER): Payer: BC Managed Care – PPO | Admitting: Ophthalmology

## 2019-10-04 DIAGNOSIS — M66872 Spontaneous rupture of other tendons, left ankle and foot: Secondary | ICD-10-CM

## 2019-10-04 DIAGNOSIS — H33302 Unspecified retinal break, left eye: Secondary | ICD-10-CM | POA: Diagnosis not present

## 2019-10-04 DIAGNOSIS — H43813 Vitreous degeneration, bilateral: Secondary | ICD-10-CM | POA: Diagnosis not present

## 2019-10-04 DIAGNOSIS — H2512 Age-related nuclear cataract, left eye: Secondary | ICD-10-CM

## 2019-10-04 DIAGNOSIS — M76821 Posterior tibial tendinitis, right leg: Secondary | ICD-10-CM

## 2019-10-04 DIAGNOSIS — M76829 Posterior tibial tendinitis, unspecified leg: Secondary | ICD-10-CM

## 2019-10-08 ENCOUNTER — Ambulatory Visit: Payer: BC Managed Care – PPO | Attending: Internal Medicine

## 2019-10-08 DIAGNOSIS — Z23 Encounter for immunization: Secondary | ICD-10-CM | POA: Insufficient documentation

## 2019-10-08 NOTE — Progress Notes (Signed)
   Covid-19 Vaccination Clinic  Name:  Cynthia Hendricks    MRN: [id number] DOB: 26-Sep-1965  10/08/2019  Cynthia Hendricks was observed post Covid-19 immunization for 15 minutes without incidence. She was provided with Vaccine Information Sheet and instruction to access the V-Safe system.   Cynthia Hendricks was instructed to call 911 with any severe reactions post vaccine: Marland Kitchen Difficulty breathing  . Swelling of your face and throat  . A fast heartbeat  . A bad rash all over your body  . Dizziness and weakness    Immunizations Administered    Name Date Dose VIS Date Route   Pfizer COVID-19 Vaccine 10/08/2019  5:48 PM 0.3 mL 07/22/2019 Intramuscular   Manufacturer: [employer], [employer]   Lot: [id number]   NDC: [address]-2

## 2019-10-16 NOTE — Progress Notes (Signed)
Subjective: Cynthia Hendricks is a 54 y.o. is seen today in office s/p left FDL transfer due to ruptured posterior tibial tendon.  Overall she states that she is doing well without any pain.  She started physical therapy and this has been helpful.  She denies any recent injury or changes since I last saw her.  Denies any systemic symptoms including fevers, chills, nausea, vomiting.  No calf pain, chest pain, shortness of breath.   Objective: General: No acute distress, AAOx3  DP/PT pulses palpable 2/4, CRT < 3 sec to all digits.  Protective sensation intact. Motor function intact.  LEFT foot: Incision is well coapted without any evidence of dehiscence and a scar has formed.  There is minimal swelling and no significant discomfort at surgical site.  She has pain in the ankle and foot the range of motion with any issues. No other open lesions or pre-ulcerative lesions.  No pain with calf compression, swelling, warmth, erythema.   Assessment and Plan:  Status post left FDL transfer, doing well with no complications   -Treatment options discussed including all alternatives, risks, and complications -She is continue to make good improvements.  She can walk in the cam boot with physical therapy.  Discussed continuing physical therapy and if she starts to feel better than we will start to transition to weightbearing as tolerated in a regular shoe with an ankle brace.  Continue to ice and elevate as well.  Return in about 3 weeks (around 10/25/2019).  Vivi Barrack DPM

## 2019-10-25 ENCOUNTER — Ambulatory Visit (INDEPENDENT_AMBULATORY_CARE_PROVIDER_SITE_OTHER): Payer: BC Managed Care – PPO | Admitting: Podiatry

## 2019-10-25 ENCOUNTER — Encounter: Payer: Self-pay | Admitting: Podiatry

## 2019-10-25 ENCOUNTER — Other Ambulatory Visit: Payer: Self-pay

## 2019-10-25 VITALS — Temp 97.8°F

## 2019-10-25 DIAGNOSIS — M66872 Spontaneous rupture of other tendons, left ankle and foot: Secondary | ICD-10-CM

## 2019-10-25 DIAGNOSIS — M76829 Posterior tibial tendinitis, unspecified leg: Secondary | ICD-10-CM

## 2019-10-25 NOTE — Patient Instructions (Signed)

## 2019-10-29 ENCOUNTER — Ambulatory Visit: Payer: BC Managed Care – PPO | Attending: Internal Medicine

## 2019-10-29 DIAGNOSIS — Z23 Encounter for immunization: Secondary | ICD-10-CM

## 2019-10-29 NOTE — Progress Notes (Signed)
   Covid-19 Vaccination Clinic  Name:  Shalon Salado    MRN: [id number] DOB: May 09, 1966  10/29/2019  Ms. Swint was observed post Covid-19 immunization for 15 minutes without incident. She was provided with Vaccine Information Sheet and instruction to access the V-Safe system.   Ms. Loma was instructed to call 911 with any severe reactions post vaccine: Marland Kitchen Difficulty breathing  . Swelling of face and throat  . A fast heartbeat  . A bad rash all over body  . Dizziness and weakness   Immunizations Administered    Name Date Dose VIS Date Route   Pfizer COVID-19 Vaccine 10/29/2019  4:24 PM 0.3 mL 07/22/2019 Intramuscular   Manufacturer: [employer], [employer]   Lot: [id number]   NDC: [address]-2

## 2019-10-30 NOTE — Progress Notes (Signed)
Subjective: Cynthia Hendricks is a 54 y.o. is seen today in office s/p left FDL transfer due to ruptured posterior tibial tendon.  Overall she states that she is doing great.  She has no new concerns and she states the foot feels so much better.  She been walking with the boot if she goes long distances in a tennis shoe short distances.  She is still doing physical therapy.  No recent injury or falls.  She also presents to pick up orthotics. Denies any systemic symptoms including fevers, chills, nausea, vomiting.  No calf pain, chest pain, shortness of breath.   Objective: General: No acute distress, AAOx3  DP/PT pulses palpable 2/4, CRT < 3 sec to all digits.  Protective sensation intact. Motor function intact.  LEFT foot: Incision is well coapted without any evidence of dehiscence and a scar has formed.  There is minimal swelling to the surgical site.  There is no pain. MMT 5/5, ROM intact.  Flatfoot is present.  No other areas of discomfort identified today. No other open lesions or pre-ulcerative lesions.  No pain with calf compression, swelling, warmth, erythema.   Assessment and Plan:  Status post left FDL transfer, doing well with no complications   -Treatment options discussed including all alternatives, risks, and complications -She still continue to make good improvements and overall doing very well.  I want her to get to the point where she can wear tennis shoe full-time.  Orthotics were dispensed today.  Oral and written break-in instructions were discussed.  Hopefully this will help with the flap and help of any further flexor tendinitis, tear.  Return in about 4 weeks (around 11/22/2019).   Vivi Barrack DPM

## 2019-11-18 ENCOUNTER — Other Ambulatory Visit: Payer: Self-pay | Admitting: Specialist

## 2019-11-18 DIAGNOSIS — Z1231 Encounter for screening mammogram for malignant neoplasm of breast: Secondary | ICD-10-CM

## 2019-11-21 ENCOUNTER — Encounter: Payer: BC Managed Care – PPO | Admitting: Podiatry

## 2019-11-21 ENCOUNTER — Ambulatory Visit (INDEPENDENT_AMBULATORY_CARE_PROVIDER_SITE_OTHER): Payer: BC Managed Care – PPO | Admitting: Podiatry

## 2019-11-21 ENCOUNTER — Other Ambulatory Visit: Payer: Self-pay

## 2019-11-21 ENCOUNTER — Encounter: Payer: Self-pay | Admitting: Podiatry

## 2019-11-21 VITALS — Temp 97.6°F

## 2019-11-21 DIAGNOSIS — M76829 Posterior tibial tendinitis, unspecified leg: Secondary | ICD-10-CM

## 2019-11-21 DIAGNOSIS — M66872 Spontaneous rupture of other tendons, left ankle and foot: Secondary | ICD-10-CM

## 2019-11-21 MED ORDER — MELOXICAM 15 MG PO TABS: 15.0000 mg | ORAL_TABLET | Freq: Every day | ORAL | 0 refills | Status: DC

## 2019-11-22 ENCOUNTER — Encounter: Payer: BC Managed Care – PPO | Admitting: Podiatry

## 2019-11-27 NOTE — Progress Notes (Signed)
Subjective: Cynthia Hendricks is a 54 y.o. is seen today in office s/p left FDL transfer due to ruptured posterior tibial tendon.  Overall she states that she is been doing well however over the weekend she did notice a small amount of burning sensation in the medial aspect of the midfoot.  She states it does not hurt to walk but she did notice on Saturday.  She is returned to the brace which is been helping.  No recent injury or falls.  No increase in swelling or redness. Denies any systemic symptoms including fevers, chills, nausea, vomiting.  No calf pain, chest pain, shortness of breath.   Objective: General: No acute distress, AAOx3  DP/PT pulses palpable 2/4, CRT < 3 sec to all digits.  Protective sensation intact. Motor function intact.  LEFT foot: Incision is well coapted without any evidence of dehiscence and a scar has formed.  Overall the tendon appears to be intact.  There is no increase in edema there is no erythema or warmth.  Flatfoot is evident. No other open lesions or pre-ulcerative lesions.  No pain with calf compression, swelling, warmth, erythema.   Assessment and Plan:  Status post left FDL transfer, doing well with no complications   -Treatment options discussed including all alternatives, risks, and complications -Although she does not have any significant pain there is no increase in swelling I want her to return to the cam boot and I prescribed meloxicam to take as needed.  Continue to ice elevate.  As she starts to feel better and the symptoms resolve then we can transition back into regular shoe.  Return in about 2 weeks (around 12/05/2019).  Vivi Barrack DPM

## 2019-12-08 ENCOUNTER — Ambulatory Visit: Payer: BC Managed Care – PPO | Admitting: Podiatry

## 2019-12-12 ENCOUNTER — Other Ambulatory Visit: Payer: Self-pay | Admitting: Podiatry

## 2019-12-19 ENCOUNTER — Ambulatory Visit
Admission: RE | Admit: 2019-12-19 | Discharge: 2019-12-19 | Disposition: A | Discharge status: Home / Self Care | Payer: BC Managed Care – PPO | Source: Ambulatory Visit | Attending: Specialist | Admitting: Specialist

## 2019-12-19 ENCOUNTER — Other Ambulatory Visit: Payer: Self-pay

## 2019-12-19 DIAGNOSIS — Z1231 Encounter for screening mammogram for malignant neoplasm of breast: Secondary | ICD-10-CM

## 2019-12-20 ENCOUNTER — Other Ambulatory Visit: Payer: Self-pay | Admitting: Specialist

## 2019-12-20 DIAGNOSIS — R928 Other abnormal and inconclusive findings on diagnostic imaging of breast: Secondary | ICD-10-CM

## 2019-12-27 ENCOUNTER — Other Ambulatory Visit: Payer: Self-pay

## 2019-12-27 ENCOUNTER — Ambulatory Visit (INDEPENDENT_AMBULATORY_CARE_PROVIDER_SITE_OTHER): Payer: BC Managed Care – PPO | Admitting: Podiatry

## 2019-12-27 DIAGNOSIS — M76829 Posterior tibial tendinitis, unspecified leg: Secondary | ICD-10-CM

## 2019-12-27 DIAGNOSIS — M66872 Spontaneous rupture of other tendons, left ankle and foot: Secondary | ICD-10-CM | POA: Diagnosis not present

## 2019-12-30 ENCOUNTER — Ambulatory Visit
Admission: RE | Admit: 2019-12-30 | Discharge: 2019-12-30 | Disposition: A | Discharge status: Home / Self Care | Payer: BC Managed Care – PPO | Source: Ambulatory Visit | Attending: Specialist | Admitting: Specialist

## 2019-12-30 ENCOUNTER — Other Ambulatory Visit: Payer: Self-pay

## 2019-12-30 DIAGNOSIS — R928 Other abnormal and inconclusive findings on diagnostic imaging of breast: Secondary | ICD-10-CM

## 2020-01-03 NOTE — Progress Notes (Signed)
Subjective: Cynthia Hendricks is a 54 y.o. is seen today in office s/p left FDL transfer due to ruptured posterior tibial tendon.  States that she is doing a lot better.  She states that her symptoms are much improved.  She has a locking she wears the boot but otherwise has been back to her regular shoe which has been helpful with the orthotic.  At max her pain level is 5/10 is very intermittent.  She feels this if she walks too long.  She has no pain today.  Swelling is improved. Denies any systemic symptoms including fevers, chills, nausea, vomiting.  No calf pain, chest pain, shortness of breath.   Objective: General: No acute distress, AAOx3  DP/PT pulses palpable 2/4, CRT < 3 sec to all digits.  Protective sensation intact. Motor function intact.  LEFT foot: Incision is well coapted without any evidence of dehiscence and a scar has formed.  Flatfoot is present.  There is no tenderness on the flexor tendons or along the surgical site.  Ankle, subtalar range of motion intact but any restrictions.  No pain with calf compression, swelling, warmth, erythema.   Assessment and Plan:  Status post left FDL transfer-resolving tendinitis  -Treatment options discussed including all alternatives, risks, and complications -Overall she seems to be doing much better.  I want her to wear the boot if she is doing a lot of walking or standing but otherwise she can remain in a regular shoe with orthotic.  I want her to continue home stretching, rehab exercises.  Anti-inflammatories as needed.  Return in about 4 weeks (around 01/24/2020).  Vivi Barrack DPM

## 2020-01-24 ENCOUNTER — Ambulatory Visit: Payer: BC Managed Care – PPO | Admitting: Podiatry

## 2020-02-05 ENCOUNTER — Other Ambulatory Visit: Payer: Self-pay | Admitting: Podiatry

## 2020-05-10 IMAGING — MR MR ANKLE*L* W/O CM
6 series · 39 of 40 positions shown · non-contrast
Comparison: 05/09/2019

CLINICAL DATA: Left ankle swelling especially medially since
July 2018.

EXAM:
MRI OF THE LEFT ANKLE WITHOUT CONTRAST
TECHNIQUE: Multiplanar, multisequence MR imaging of the ankle was performed. No
intravenous contrast was administered.

[Series 4: T2 fat-sat · axial · 3.0mm · 0.50mm/px · z∈[-94,+58]mm · 8 of 40 slices shown (1 of 2)]
[im 1/40]
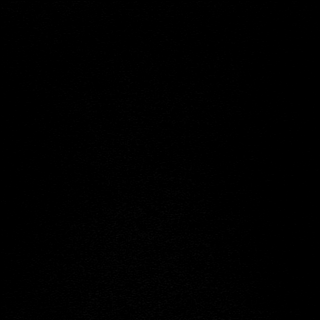
[im 6/40]
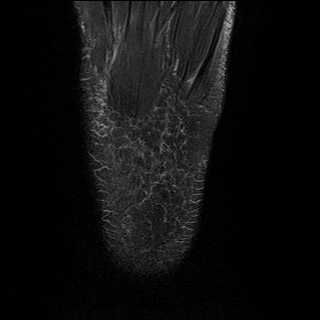
[im 12/40]
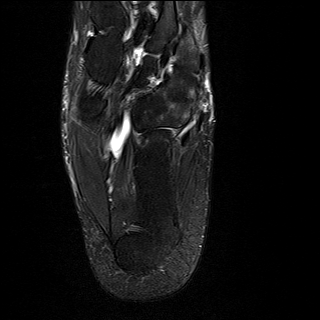
[im 17/40]
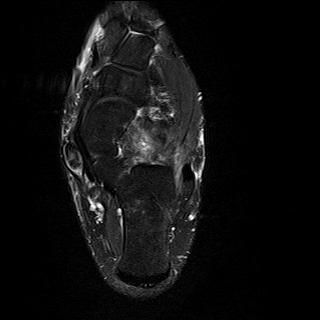
[im 23/40]
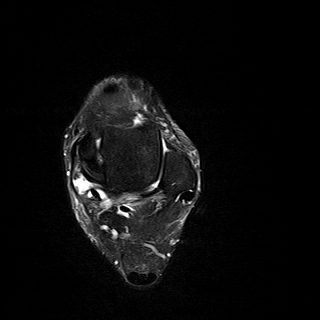
[im 28/40]
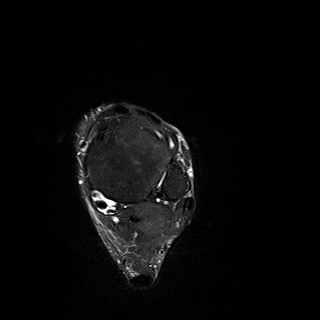
[im 34/40]
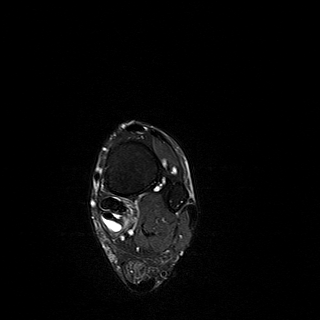
[im 40/40]
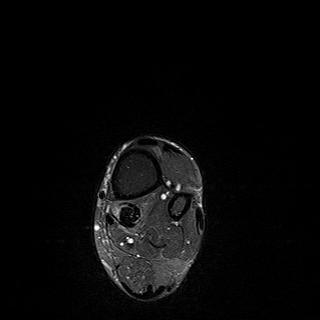

[Series 5: T1 · axial · 3.0mm · 0.50mm/px · z∈[-93,+59]mm · 8 of 40 slices shown (1 of 2)]
[im 1/40]
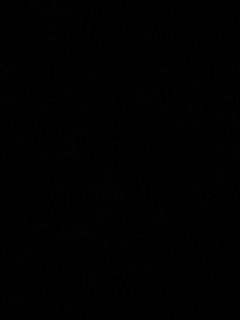
[im 6/40]
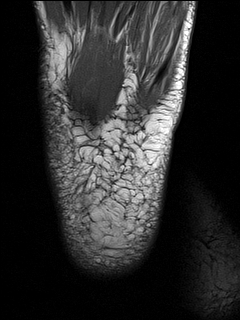
[im 12/40]
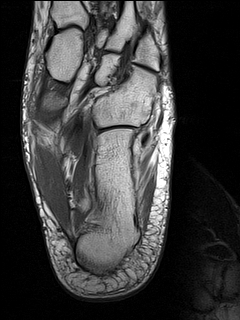
[im 17/40]
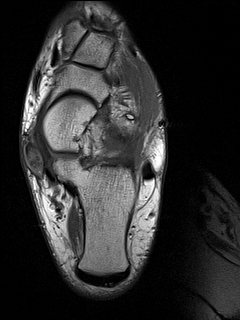
[im 23/40]
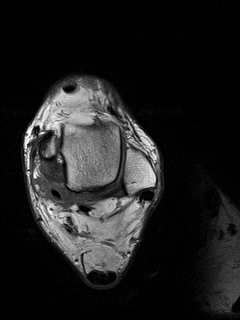
[im 28/40]
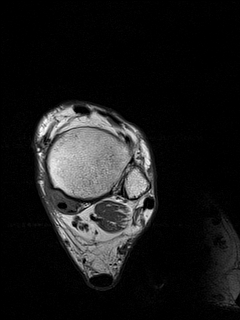
[im 34/40]
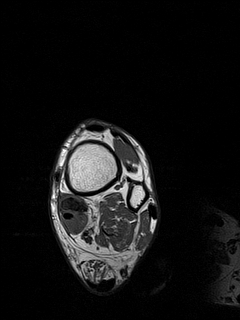
[im 40/40]
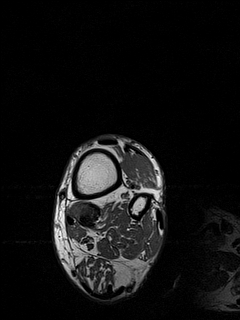

[Series 6: T1 · sagittal · 4.0mm · 0.56mm/px · 4 of 20 slices shown (2 of 2)]
[im 1/20]
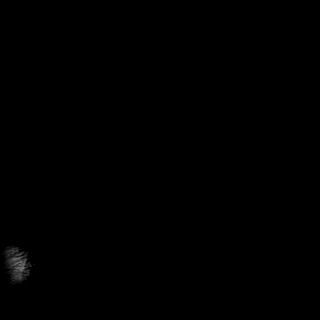
[im 7/20]
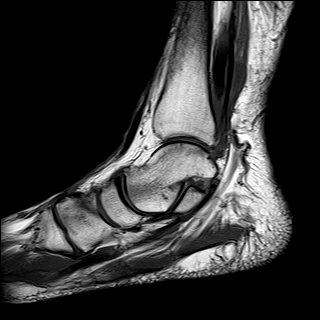
[im 13/20]
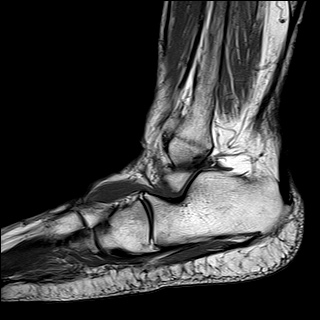
[im 20/20]
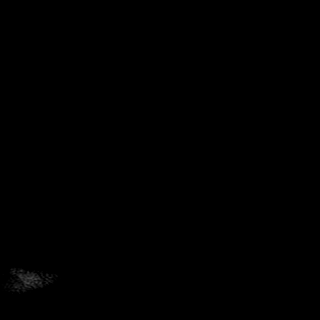

[Series 7: STIR · sagittal · 4.0mm · 0.35mm/px · 3 of 18 slices shown]
[im 1/18]
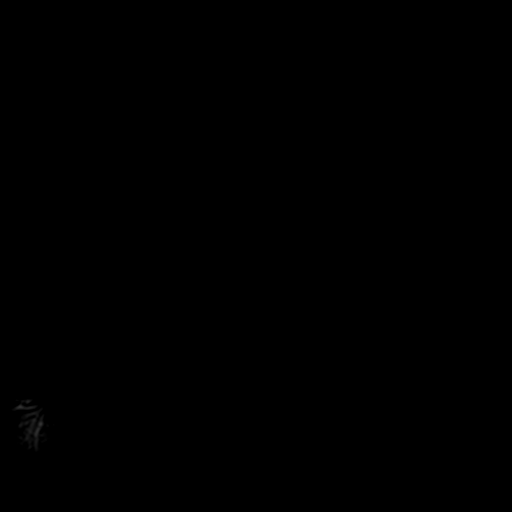
[im 6/18]
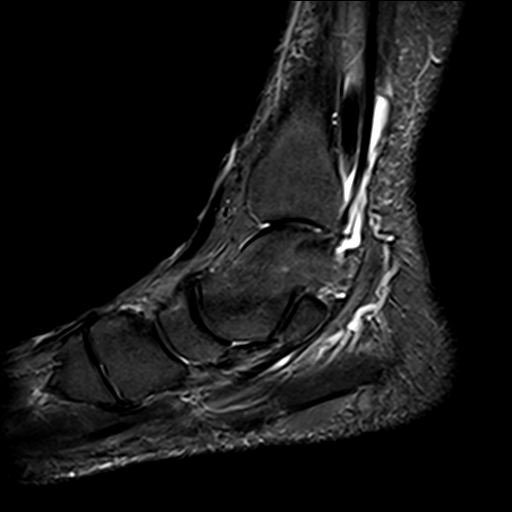
[im 12/18]
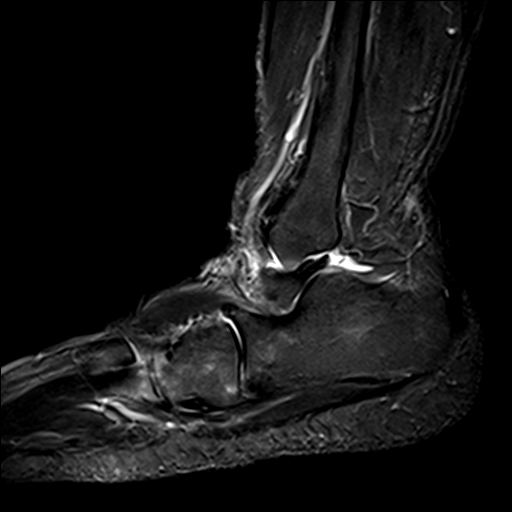

[Series 8: T2 fat-sat · coronal · 3.0mm · 0.50mm/px · 8 of 38 slices shown (2 of 2)]
[im 1/38]
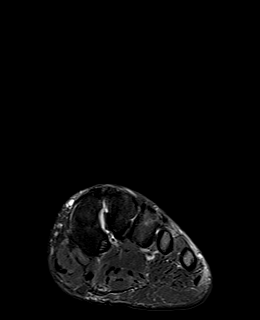
[im 6/38]
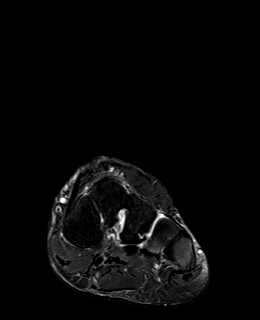
[im 11/38]
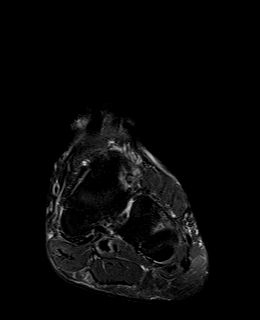
[im 16/38]
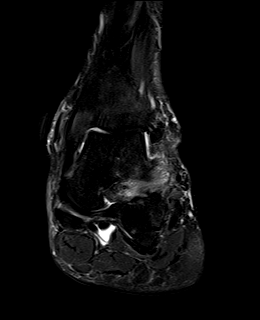
[im 22/38]
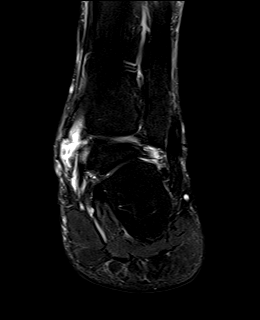
[im 27/38]
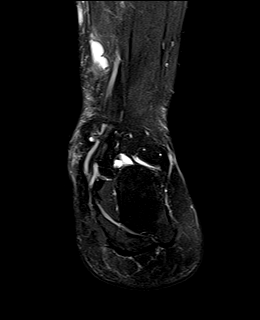
[im 32/38]
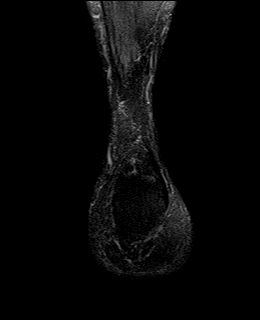
[im 38/38]
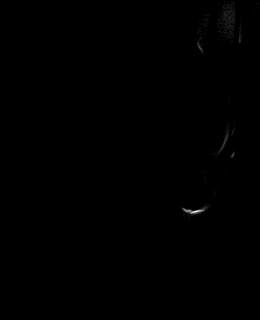

[Series 9: PD fat-sat · axial · 3.0mm · 0.42mm/px · z∈[-94,+58]mm · 8 of 40 slices shown]
[im 1/40]
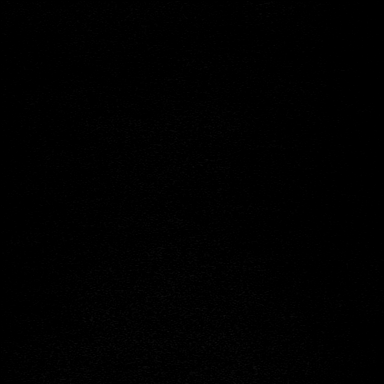
[im 6/40]
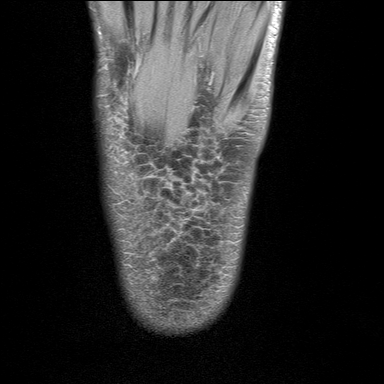
[im 12/40]
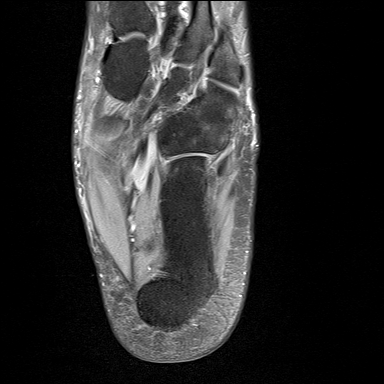
[im 17/40]
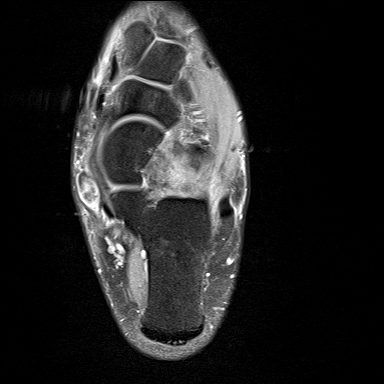
[im 23/40]
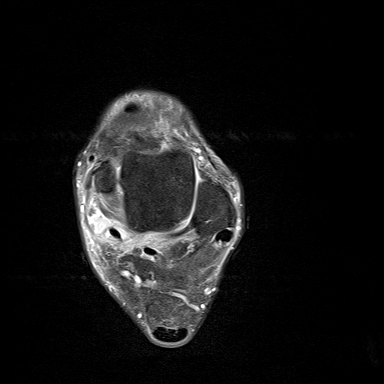
[im 28/40]
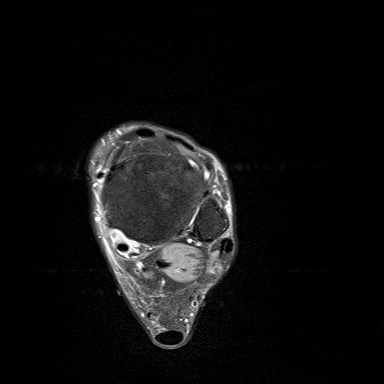
[im 34/40]
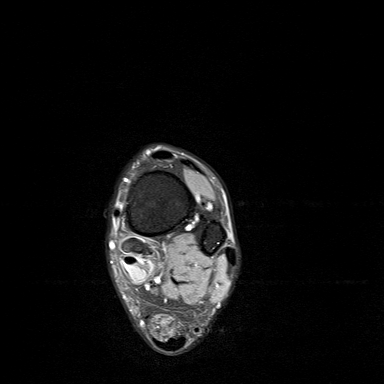
[im 40/40]
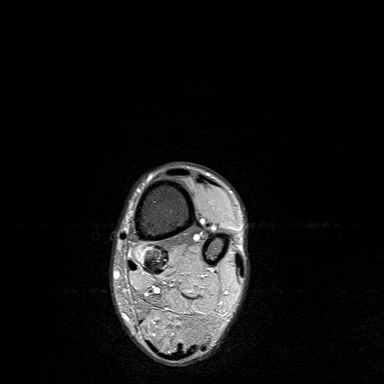

[39 of 40 positions shown; findings below may reference images not displayed]

FINDINGS: TENDONS

Peroneal: Unremarkable

Posteromedial: Ruptured tibialis posterior tendon with a 5.2 cm
fluid-filled gap between the markedly thickened distal margin of the
proximal segment and the thickened distal segment. There is tibialis
posterior, flexor digitorum longus, and flexor hallucis longus
tenosynovitis

Anterior: Unremarkable

Achilles: Unremarkable

Plantar Fascia: Unremarkable

LIGAMENTS

Lateral: Attenuated anterior talofibular ligament on images 19-21 of
series 4, likely the result of a prior tear.

Medial: Mildly thickened and mildly irregular superomedial portion
of the spring ligament. Similar thickening of the medioplantar
oblique component of the spring ligament. The tibiospring portion of
the deltoid ligament appears thickened.

CARTILAGE

Ankle Joint: Unremarkable

Subtalar Joints/Sinus Tarsi: Low-level edema in the sinus tarsi.

Bones: Patchy indistinct foci of T2 signal hyperintensity scattered
in the distal tibia, talus, calcaneus, navicular, cuboid, and
metatarsal bases.

Other: No supplemental non-categorized findings.
IMPRESSION: 1. Ruptured tibialis posterior tendon with a 5.2 cm fluid-filled gap
between the thickened distal margin of the proximal segment and the
thickened distal segment. Associated tenosynovitis of the tibialis
posterior and adjacent flexor digitorum longus and also the flexor
hallucis longus tendons.
2. Attenuated ATFL, likely previously torn.
3. Thickened appearance of the superomedial and medioplantar oblique
components of the spring ligament, as well as the tibiospring
portion of the deltoid ligament.
4. Patchy indistinct foci of accentuated T2 signal possibly from
osteoporosis of disuse or low-grade stress reaction.

## 2020-12-11 ENCOUNTER — Other Ambulatory Visit: Payer: Self-pay | Admitting: Internal Medicine

## 2020-12-11 DIAGNOSIS — Z1231 Encounter for screening mammogram for malignant neoplasm of breast: Secondary | ICD-10-CM

## 2021-02-05 ENCOUNTER — Other Ambulatory Visit: Payer: Self-pay

## 2021-02-05 ENCOUNTER — Ambulatory Visit
Admission: RE | Admit: 2021-02-05 | Discharge: 2021-02-05 | Disposition: A | Discharge status: Home / Self Care | Payer: BC Managed Care – PPO | Source: Ambulatory Visit | Attending: Internal Medicine | Admitting: Internal Medicine

## 2021-02-05 DIAGNOSIS — Z1231 Encounter for screening mammogram for malignant neoplasm of breast: Secondary | ICD-10-CM

## 2022-02-10 ENCOUNTER — Other Ambulatory Visit: Payer: Self-pay | Admitting: Internal Medicine

## 2022-02-10 DIAGNOSIS — Z1231 Encounter for screening mammogram for malignant neoplasm of breast: Secondary | ICD-10-CM

## 2022-02-14 ENCOUNTER — Ambulatory Visit
Admission: RE | Admit: 2022-02-14 | Discharge: 2022-02-14 | Disposition: A | Discharge status: Home / Self Care | Payer: BC Managed Care – PPO | Source: Ambulatory Visit | Attending: Internal Medicine | Admitting: Internal Medicine

## 2022-02-14 DIAGNOSIS — Z1231 Encounter for screening mammogram for malignant neoplasm of breast: Secondary | ICD-10-CM

## 2022-07-08 ENCOUNTER — Ambulatory Visit: Payer: BC Managed Care – PPO | Attending: Internal Medicine | Admitting: Physical Therapy

## 2022-07-08 ENCOUNTER — Encounter: Payer: Self-pay | Admitting: Physical Therapy

## 2022-07-08 ENCOUNTER — Other Ambulatory Visit: Payer: Self-pay

## 2022-07-08 DIAGNOSIS — M5412 Radiculopathy, cervical region: Secondary | ICD-10-CM | POA: Insufficient documentation

## 2022-07-08 DIAGNOSIS — R293 Abnormal posture: Secondary | ICD-10-CM | POA: Insufficient documentation

## 2022-07-08 NOTE — Therapy (Addendum)
OUTPATIENT PHYSICAL THERAPY CERVICAL EVALUATION / Discharge   Patient Name: Cynthia Hendricks MRN: [id number] DOB:10-08-1965, 56 y.o., female Today's Date: 07/08/2022  END OF SESSION:  PT End of Session - 07/08/22 1010     Visit Number 1    Number of Visits 13    Date for PT Re-Evaluation 09/02/22    PT Start Time 1011    PT Stop Time 1100    PT Time Calculation (min) 49 min    Activity Tolerance Patient tolerated treatment well    Behavior During Therapy [facility] for tasks assessed/performed             Past Medical History:  Diagnosis Date   Diabetes mellitus    Fluid retention    Fracture of fibula, distal, right, closed 03/05/2012   Hypertension    Sleep apnea 2006   tested mod -tried cpap-not using-said better   Past Surgical History:  Procedure Laterality Date   ABDOMINAL HYSTERECTOMY  08/11/2002   abd hyst-lt ovarian cyst   DIAGNOSTIC LAPAROSCOPY  08/11/2006   bilat SO-Adhesions   DILATION AND CURETTAGE OF UTERUS     ORIF ANKLE FRACTURE  03/05/2012   Procedure: OPEN REDUCTION INTERNAL FIXATION (ORIF) ANKLE FRACTURE;  Surgeon: Eulas Post, MD;  Location: [facility];  Service: Orthopedics;  Laterality: Right;  OPEN TREATMENT BIMALLEOLAR ANKLE INCLUDES INTERNAL FIXATION   tendon repair in ankle Left 2021   L ankle   Patient Active Problem List   Diagnosis Date Noted   Nontraumatic rupture of tendons of left foot and ankle 06/20/2019   Atrophy of vagina 09/30/2018   Reduced libido 09/30/2018   Lumbar radiculopathy 09/01/2018   Hypercholesterolemia 08/23/2018   Type 2 diabetes mellitus (HCC) 08/23/2018   Chlamydial infection 06/12/2017   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   Fracture of fibula, distal, right, closed 03/05/2012    PCP: Ralene Ok, MD   REFERRING PROVIDER: Ralene Ok, MD   REFERRING DIAG: Cervical Radiculopathy  THERAPY DIAG:  Radiculopathy, cervical region - Plan: PT plan of care  cert/re-cert  Abnormal posture - Plan: PT plan of care cert/re-cert  Rationale for Evaluation and Treatment: Rehabilitation  ONSET DATE: 1 month ago  SUBJECTIVE:                                                                                                                                                                                                         SUBJECTIVE STATEMENT: Pt reports pain in the neck that started a month ago with no specific MOI. She  reports N/T in  the LUE all the into all the fingers of hand. She denies any of hx or neck or shoulder problems and reports the symptoms are worsening since onset.   PERTINENT HISTORY:  DM, HTN  PAIN:  Are you having pain? Yes: NPRS scale: 7/10 Pain location: L neck and shoulder with referred N/T to the hand Pain description: Numbness/ tingling Aggravating factors: Laying down on the right side.  Relieving factors: holding left arm up in the are.   PRECAUTIONS: None  WEIGHT BEARING RESTRICTIONS: No  FALLS:  Has patient fallen in last 6 months? No  LIVING ENVIRONMENT: Lives with: lives with their family Lives in: House/apartment Stairs: No Has following equipment at home: None  OCCUPATION: Bus driver  PLOF: Independent  PATIENT GOALS: to decrease neck pain and L arm discomfort. Return to prior level of function  NEXT MD VISIT:   OBJECTIVE:   DIAGNOSTIC FINDINGS:  N/A  PATIENT SURVEYS:  FOTO   56% and predicted 68%  COGNITION: Overall cognitive status: Within functional limits for tasks assessed  SENSATION: WFL  POSTURE: rounded shoulders and forward head  PALPATION: TTP along L cervical facets C4-C6, increased tension/ trigger points noted in the upper trap/ levator scapulae  .  CERVICAL ROM:   Active ROM A/PROM (deg) eval  Flexion 58  Extension 60 *  Right lateral flexion 20  Left lateral flexion 40 *  Right rotation 65  Left rotation 57 *   (Blank rows = not tested) *- produced concordant  pain  UPPER EXTREMITY ROM:  Active ROM Right eval Left eval  Shoulder flexion    Shoulder extension    Shoulder abduction    Shoulder adduction    Shoulder extension    Shoulder internal rotation    Shoulder external rotation    Elbow flexion    Elbow extension    Wrist flexion    Wrist extension    Wrist ulnar deviation    Wrist radial deviation    Wrist pronation    Wrist supination     (Blank rows = not tested)  UPPER EXTREMITY MMT:  MMT Right eval Left eval  Shoulder flexion 26.4 31.9  Shoulder extension 42.3 44.1  Shoulder abduction 25.5 25.8  Shoulder adduction    Shoulder extension    Shoulder internal rotation 31.8 31.7  Shoulder external rotation 20.3 24.4  Middle trapezius    Lower trapezius    Elbow flexion    Elbow extension    Wrist flexion    Wrist extension    Wrist ulnar deviation    Wrist radial deviation    Wrist pronation    Wrist supination    Grip strength     (Blank rows = not tested) assessed with HHD in sitting  CERVICAL SPECIAL TESTS:  Upper limb tension test (ULTT): Positive, Spurling's test: Negative, and Distraction test: Positive   TODAY'S TREATMENT:  [facility] Adult PT Treatment:                                                DATE: 07/08/2022 Therapeutic Exercise: Seated chin tuck 1 x 10 holding 5 seconds Seated L upper trap/ levator scapulae stretch 2 x 30 sec Scapular retraction 1 x 10 holding 3 seconds ( tactile cues to avoid hiking shoulders) Manual Therapy: MTPR along the L upper trap x 2 Sub-occipital release   PATIENT EDUCATION:  Education details: Evaluation findings, POC, Goals, HEP with proper form and rationale. Person educated: Patient Education method: Verbal cues and Handouts Education comprehension: verbalized understanding  HOME EXERCISE PROGRAM: Access Code: [email or web address] URL:  [email or web address] Date: 07/08/2022 Prepared by: Lulu Riding  Exercises - Supine Chin Tuck with Towel  - 1 x daily - 7 x weekly - 2 sets - 10 reps - 5 sec hold - Standing Cervical Retraction  - 1 x daily - 7 x weekly - 2 sets - 10 reps - 5 sec hold - Seated Upper Trapezius Stretch (Mirrored)  - 3 x daily - 7 x weekly - 2 sets - 2 reps - 30 seconds hold - Gentle Levator Scapulae Stretch  - 1 x daily - 7 x weekly - 2 sets - 2 reps - 30 seconds hold - Seated Scapular Retraction  - 1 x daily - 7 x weekly - 2 sets - 10 reps - 5 seconds hold  ASSESSMENT:  CLINICAL IMPRESSION: Patient is a 56 y.o. F who was seen today for physical therapy evaluation and treatment for neck and shoulder pain. Pt demonstrates mild limitation with L cervical rotation and reproduction of concordant pain with extension and L sidebending. TTP along L upper trap, L cervical facets at C4-C6. Special testing cluster for cervical radiculopathy is 3/4 postive tests indicating high liklihood of cervical radiculopathy and confirming referral dx. She responed well in session with stretching the upper trap, and chin tucks/ scapular retraction to work on posture reporting no LUE referred symptoms end of session. She would benefit from physical therapy to promote efficient posture/ lifting mechanics, reduce LUE referred symptoms, and maximize her function by addressing the deficits listed.   OBJECTIVE IMPAIRMENTS: decreased activity tolerance, decreased ROM, increased muscle spasms, postural dysfunction, and pain.   ACTIVITY LIMITATIONS: lifting, sitting, and standing  PARTICIPATION LIMITATIONS: occupation  PERSONAL FACTORS: Age and 1 comorbidity: DM  are also affecting patient's functional outcome.   REHAB POTENTIAL: Good  CLINICAL DECISION MAKING: Evolving/moderate complexity  EVALUATION COMPLEXITY: Moderate   GOALS: Goals reviewed with patient? Yes  SHORT TERM GOALS: Target date: 08/05/2022  Pt  to be IND with initial HEP for therapeutic progression Baseline:  Goal status: INITIAL  2.  Pt to verbalize/ demo efficient posture and lifting mechanics to reduce and prevent neck pain and LUE referred symtpoms Baseline:  Goal status: INITIAL  3.  Pt to report intermittent LUE referred symptoms to </= 5 x in 1 week to demo improving condition Baseline:  Goal status: INITIAL  LONG TERM GOALS: Target date: 09/02/2022  Pt to increase Cervical L rotation by >/= 8 degrees and maintain all functional cervical flexion/ extension and Rotation ROM with no report of LUE referred symptoms Baseline:  Goal status: INITIAL  2.  Pt to report no LUE referred symptoms for >/= 2 weeks to demo improvement in function Baseline:  Goal status: INITIAL  3.  Pt to increase FOTO score to >/= 68% to demo improvement in function Baseline:  Goal status: INITIAL  4.  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  Baseline:  Goal status: INITIAL  5.  Pt to be IND with all HEP and will be able to maintain and progress current LOF IND.  Baseline:  Goal status: INITIAL    PLAN:  PT FREQUENCY: 1-2x/week  PT DURATION: 8 weeks  PLANNED INTERVENTIONS: Therapeutic exercises, Therapeutic activity, Neuromuscular re-education, Balance training, Gait training, Patient/Family education, Self Care, Joint mobilization, Stair training, Aquatic Therapy, Dry Needling, Spinal manipulation, Spinal mobilization, Cryotherapy, Moist heat, Taping, Traction, Ultrasound, Ionotophoresis 4mg /ml Dexamethasone, Manual therapy, and Re-evaluation  PLAN FOR NEXT SESSION: Review/ update HEP PRN. Posture education, posterior chain strengthening, trial manual traction vs mechanical, assess nerve flossing for LUE. STW along upper trap and L cervical paraspinals.    Malgorzata Albert PT, DPT, LAT, ATC  07/08/22  11:22 AM        Patient did not return after her evaluation resulting in discharge from PT.  07/10/22 PT, DPT, LAT, ATC  09/16/22  1:12 PM

## 2022-07-22 ENCOUNTER — Ambulatory Visit: Payer: BC Managed Care – PPO | Admitting: Physical Therapy

## 2022-07-24 ENCOUNTER — Ambulatory Visit: Payer: BC Managed Care – PPO | Admitting: Physical Therapy

## 2022-07-29 ENCOUNTER — Encounter: Payer: BC Managed Care – PPO | Admitting: Physical Therapy

## 2022-07-31 ENCOUNTER — Ambulatory Visit: Payer: BC Managed Care – PPO | Admitting: Physical Therapy

## 2022-08-07 ENCOUNTER — Ambulatory Visit: Payer: BC Managed Care – PPO | Admitting: Physical Therapy

## 2022-08-12 ENCOUNTER — Ambulatory Visit: Payer: BC Managed Care – PPO | Admitting: Physical Therapy

## 2022-08-12 NOTE — Therapy (Incomplete)
OUTPATIENT PHYSICAL THERAPY TREATMENT   Patient Name: Cynthia Hendricks MRN: [id number] DOB:08-24-1965, 57 y.o., female Today's Date: 08/12/2022  END OF SESSION:    Past Medical History:  Diagnosis Date   Diabetes mellitus    Fluid retention    Fracture of fibula, distal, right, closed 03/05/2012   Hypertension    Sleep apnea 2006   tested mod -tried cpap-not using-said better   Past Surgical History:  Procedure Laterality Date   ABDOMINAL HYSTERECTOMY  08/11/2002   abd hyst-lt ovarian cyst   DIAGNOSTIC LAPAROSCOPY  08/11/2006   bilat SO-Adhesions   DILATION AND CURETTAGE OF UTERUS     ORIF ANKLE FRACTURE  03/05/2012   Procedure: OPEN REDUCTION INTERNAL FIXATION (ORIF) ANKLE FRACTURE;  Surgeon: Eulas Post, MD;  Location: [facility];  Service: Orthopedics;  Laterality: Right;  OPEN TREATMENT BIMALLEOLAR ANKLE INCLUDES INTERNAL FIXATION   tendon repair in ankle Left 2021   L ankle   Patient Active Problem List   Diagnosis Date Noted   Nontraumatic rupture of tendons of left foot and ankle 06/20/2019   Atrophy of vagina 09/30/2018   Reduced libido 09/30/2018   Lumbar radiculopathy 09/01/2018   Hypercholesterolemia 08/23/2018   Type 2 diabetes mellitus (HCC) 08/23/2018   Chlamydial infection 06/12/2017   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  Rationale for Evaluation and Treatment: Rehabilitation  ONSET DATE: 1 month ago  SUBJECTIVE:                                                                                                                                                                                                         SUBJECTIVE STATEMENT: ***  PERTINENT HISTORY:  DM, HTN  PAIN:  Are you having pain? Yes:  NPRS scale: 7/10 Pain location: L neck and shoulder with referred N/T to the hand Pain description: Numbness/ tingling Aggravating factors: Laying down on the right side.  Relieving factors: holding left arm up in the are.   PRECAUTIONS: None  WEIGHT BEARING RESTRICTIONS: No  FALLS:  Has patient fallen in last 6 months? No  LIVING ENVIRONMENT: Lives with: lives with their family Lives in: House/apartment Stairs: No Has following equipment at home: None  OCCUPATION: Bus driver  PLOF: Independent  PATIENT GOALS: to decrease neck pain and L arm discomfort. Return to prior level of function  NEXT MD VISIT:   OBJECTIVE:   DIAGNOSTIC FINDINGS:  N/A  PATIENT SURVEYS:  FOTO   56% and predicted 68%  COGNITION: Overall cognitive status: Within functional limits for tasks assessed  SENSATION: WFL  POSTURE: rounded shoulders and forward head  PALPATION: TTP along L cervical facets C4-C6, increased tension/ trigger points noted in the upper trap/ levator scapulae  .  CERVICAL ROM:   Active ROM A/PROM (deg) eval  Flexion 58  Extension 60 *  Right lateral flexion 20  Left lateral flexion 40 *  Right rotation 65  Left rotation 57 *   (Blank rows = not tested) *- produced concordant pain  UPPER EXTREMITY ROM:  Active ROM Right eval Left eval  Shoulder flexion    Shoulder extension    Shoulder abduction    Shoulder adduction    Shoulder extension    Shoulder internal rotation    Shoulder external rotation    Elbow flexion    Elbow extension    Wrist flexion    Wrist extension    Wrist ulnar deviation    Wrist radial deviation    Wrist pronation    Wrist supination     (Blank rows = not tested)  UPPER EXTREMITY MMT:  MMT Right eval Left eval  Shoulder flexion 26.4 31.9  Shoulder extension 42.3 44.1  Shoulder abduction 25.5 25.8  Shoulder adduction    Shoulder extension    Shoulder internal rotation 31.8 31.7  Shoulder external rotation 20.3 24.4   Middle trapezius    Lower trapezius    Elbow flexion    Elbow extension    Wrist flexion    Wrist extension    Wrist ulnar deviation    Wrist radial deviation    Wrist pronation    Wrist supination    Grip strength     (Blank rows = not tested) assessed with HHD in sitting  CERVICAL SPECIAL TESTS:  Upper limb tension test (ULTT): Positive, Spurling's test: Negative, and Distraction test: Positive   TODAY'S TREATMENT:                                                                                                                              [facility] Adult PT Treatment:                                                DATE: 08/12/2022 Therapeutic Exercise: *** Manual Therapy: *** Neuromuscular re-ed: *** Therapeutic Activity: *** Modalities: *** Self Care: [facility] Adult PT Treatment:                                                DATE: 07/08/2022 Therapeutic  Exercise: Seated chin tuck 1 x 10 holding 5 seconds Seated L upper trap/ levator scapulae stretch 2 x 30 sec Scapular retraction 1 x 10 holding 3 seconds ( tactile cues to avoid hiking shoulders) Manual Therapy: MTPR along the L upper trap x 2 Sub-occipital release   PATIENT EDUCATION:  Education details: Evaluation findings, POC, Goals, HEP with proper form and rationale. Person educated: Patient Education method: Verbal cues and Handouts Education comprehension: verbalized understanding  HOME EXERCISE PROGRAM: Access Code: [email or web address] URL: [email or web address] Date: 07/08/2022 Prepared by: Starr Lake  Exercises - Supine Chin Tuck with Towel  - 1 x daily - 7 x weekly - 2 sets - 10 reps - 5 sec hold - Standing Cervical Retraction  - 1 x daily - 7 x weekly - 2 sets - 10 reps - 5 sec hold - Seated Upper Trapezius Stretch (Mirrored)  - 3 x daily - 7 x weekly - 2 sets - 2 reps - 30 seconds hold - Gentle Levator Scapulae Stretch  - 1 x daily - 7 x weekly - 2 sets - 2 reps - 30 seconds hold -  Seated Scapular Retraction  - 1 x daily - 7 x weekly - 2 sets - 10 reps - 5 seconds hold  ASSESSMENT:  CLINICAL IMPRESSION: ***  OBJECTIVE IMPAIRMENTS: decreased activity tolerance, decreased ROM, increased muscle spasms, postural dysfunction, and pain.   ACTIVITY LIMITATIONS: lifting, sitting, and standing  PARTICIPATION LIMITATIONS: occupation  PERSONAL FACTORS: Age and 1 comorbidity: DM  are also affecting patient's functional outcome.   REHAB POTENTIAL: Good  CLINICAL DECISION MAKING: Evolving/moderate complexity  EVALUATION COMPLEXITY: Moderate   GOALS: Goals reviewed with patient? Yes  SHORT TERM GOALS: Target date: 08/05/2022  Pt to be IND with initial HEP for therapeutic progression Baseline:  Goal status: INITIAL  2.  Pt to verbalize/ demo efficient posture and lifting mechanics to reduce and prevent neck pain and LUE referred symtpoms Baseline:  Goal status: INITIAL  3.  Pt to report intermittent LUE referred symptoms to </= 5 x in 1 week to demo improving condition Baseline:  Goal status: INITIAL  LONG TERM GOALS: Target date: 09/02/2022  Pt to increase Cervical L rotation by >/= 8 degrees and maintain all functional cervical flexion/ extension and Rotation ROM with no report of LUE referred symptoms Baseline:  Goal status: INITIAL  2.  Pt to report no LUE referred symptoms for >/= 2 weeks to demo improvement in function Baseline:  Goal status: INITIAL  3.  Pt to increase FOTO score to >/= 68% to demo improvement in function Baseline:  Goal status: INITIAL  4.  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  Baseline:  Goal status: INITIAL  5.  Pt to be IND with all HEP and will be able to maintain and progress current LOF IND.  Baseline:  Goal status: INITIAL    PLAN:  PT FREQUENCY:  1-2x/week  PT DURATION: 8 weeks  PLANNED INTERVENTIONS: Therapeutic exercises, Therapeutic activity, Neuromuscular re-education, Balance training, Gait training, Patient/Family education, Self Care, Joint mobilization, Stair training, Aquatic Therapy, Dry Needling, Spinal manipulation, Spinal mobilization, Cryotherapy, Moist heat, Taping, Traction, Ultrasound, Ionotophoresis 4mg /ml Dexamethasone, Manual therapy, and Re-evaluation  PLAN FOR NEXT SESSION: Review/ update HEP PRN. Posture education, posterior chain strengthening, trial manual traction vs mechanical, assess nerve  flossing for LUE. STW along upper trap and L cervical paraspinals.    Nehal Witting PT, DPT, LAT, ATC  08/12/22  8:58 AM

## 2022-08-14 ENCOUNTER — Encounter: Payer: BC Managed Care – PPO | Admitting: Physical Therapy

## 2022-08-19 ENCOUNTER — Encounter: Payer: BC Managed Care – PPO | Admitting: Physical Therapy

## 2022-12-16 ENCOUNTER — Ambulatory Visit: Payer: BC Managed Care – PPO | Admitting: Podiatry

## 2022-12-19 ENCOUNTER — Ambulatory Visit (INDEPENDENT_AMBULATORY_CARE_PROVIDER_SITE_OTHER): Payer: BC Managed Care – PPO

## 2022-12-19 ENCOUNTER — Ambulatory Visit: Payer: BC Managed Care – PPO | Admitting: Podiatry

## 2022-12-19 DIAGNOSIS — M21612 Bunion of left foot: Secondary | ICD-10-CM | POA: Diagnosis not present

## 2022-12-19 NOTE — Progress Notes (Unsigned)
Subjective:   Patient ID: Cynthia Hendricks, female   DOB: 57 y.o.   MRN: [id number]   HPI Chief Complaint  Patient presents with   Bunions    Patient reports left foot bunion is causing discomfort and she is unable to wear certain types of shoes due to the pain.     Bunion only hurts with dress shoes, has to wear shoes all the  tennis time.   A1c 5.7   ROS      Objective:  Physical Exam  ***     Assessment:  ***     Plan:  ***

## 2022-12-19 NOTE — Patient Instructions (Signed)

## 2023-03-10 ENCOUNTER — Other Ambulatory Visit: Payer: Self-pay | Admitting: Internal Medicine

## 2023-03-10 DIAGNOSIS — Z1231 Encounter for screening mammogram for malignant neoplasm of breast: Secondary | ICD-10-CM

## 2023-03-13 ENCOUNTER — Ambulatory Visit: Admission: RE | Admit: 2023-03-13 | Payer: BC Managed Care – PPO | Source: Ambulatory Visit

## 2023-03-13 DIAGNOSIS — Z1231 Encounter for screening mammogram for malignant neoplasm of breast: Secondary | ICD-10-CM

## 2023-09-04 ENCOUNTER — Other Ambulatory Visit: Payer: Self-pay | Admitting: Internal Medicine

## 2023-09-04 DIAGNOSIS — E119 Type 2 diabetes mellitus without complications: Secondary | ICD-10-CM

## 2023-09-04 DIAGNOSIS — I999 Unspecified disorder of circulatory system: Secondary | ICD-10-CM

## 2023-09-11 ENCOUNTER — Ambulatory Visit
Admission: RE | Admit: 2023-09-11 | Discharge: 2023-09-11 | Disposition: A | Discharge status: Home / Self Care | Payer: 59 | Source: Ambulatory Visit | Attending: Internal Medicine | Admitting: Internal Medicine

## 2023-09-11 DIAGNOSIS — E119 Type 2 diabetes mellitus without complications: Secondary | ICD-10-CM

## 2023-09-11 DIAGNOSIS — I999 Unspecified disorder of circulatory system: Secondary | ICD-10-CM

## 2024-03-18 ENCOUNTER — Other Ambulatory Visit: Payer: Self-pay | Admitting: Internal Medicine

## 2024-03-18 DIAGNOSIS — Z1231 Encounter for screening mammogram for malignant neoplasm of breast: Secondary | ICD-10-CM

## 2024-04-13 ENCOUNTER — Ambulatory Visit

## 2024-04-14 ENCOUNTER — Ambulatory Visit
Admission: RE | Admit: 2024-04-14 | Discharge: 2024-04-14 | Disposition: A | Discharge status: Home / Self Care | Source: Ambulatory Visit | Attending: Internal Medicine | Admitting: Internal Medicine

## 2024-04-14 DIAGNOSIS — Z1231 Encounter for screening mammogram for malignant neoplasm of breast: Secondary | ICD-10-CM

## 2024-05-11 ENCOUNTER — Encounter: Payer: Self-pay | Admitting: Physician Assistant

## 2024-05-11 ENCOUNTER — Other Ambulatory Visit (INDEPENDENT_AMBULATORY_CARE_PROVIDER_SITE_OTHER): Payer: Self-pay

## 2024-05-11 ENCOUNTER — Ambulatory Visit: Admitting: Physician Assistant

## 2024-05-11 DIAGNOSIS — M25511 Pain in right shoulder: Secondary | ICD-10-CM | POA: Diagnosis not present

## 2024-05-11 DIAGNOSIS — M542 Cervicalgia: Secondary | ICD-10-CM

## 2024-05-11 DIAGNOSIS — G8929 Other chronic pain: Secondary | ICD-10-CM

## 2024-05-11 MED ORDER — MELOXICAM 15 MG PO TABS: 15.0000 mg | ORAL_TABLET | Freq: Every day | ORAL | 0 refills | Status: DC

## 2024-05-11 NOTE — Progress Notes (Signed)
 Office Visit Note   Patient: Cynthia Hendricks           Date of Birth: 05/08/1966           MRN: [id number] Visit Date: 05/11/2024              Requested by: Valma Carwin, MD [address] [place],  [place] [address] PCP: Valma Carwin, MD   Assessment & Plan: Visit Diagnoses:  1. Chronic right shoulder pain   2. 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  She has been followed by her primary care provider for neck pain and bilateral shoulder pain.  Her left shoulder is doing better but she comes in today complaining of right shoulder pain especially with overhead motion.  She also complains of some tingling in her hand and pain is reproducible with turning her neck.  With examination and x-rays I think she has elements of both impingement and cervical radiculopathy.  She has done PT in the past we will refer her again for therapy today we will start her on a regular dose of meloxicam .  She knows not to take ibuprofen  with it.  She can take Tylenol .  I would like to go forward and do a subacromial injection but she just got an IM injection on Monday.  She will schedule to come back in 2 weeks for a subacromial injection I can also see how the anti-inflammatory and PT are going.  Ultimately we will probably need to get an MRI on the more symptomatic area either the right shoulder or neck  Follow-Up Instructions: Return in about 2 weeks (around 05/25/2024).   Orders:  Orders Placed This Encounter  Procedures   XR Shoulder Right   XR Cervical Spine 2 or 3 views   Meds ordered this encounter  Medications   meloxicam  (MOBIC ) 15 MG tablet    Sig: Take 1 tablet (15 mg total) by mouth daily.    Dispense:  30 tablet    Refill:  0      Procedures: No procedures performed   Clinical Data: No additional findings.   Subjective: No chief complaint on file.   HPI patient is a pleasant 58 year old woman comes in today with a chief  complaint of right shoulder pain.  She did have a steroid injection Monday.  She denies any injury she is taking Tylenol  she also is complaining of neck stiffness and pain.  She has been followed by her primary care provider and did get an injection for what her description is IM on Monday.  She works as a Designer, industrial/product.  She denies any particular weakness  Review of Systems  All other systems reviewed and are negative.    Objective: Vital Signs: There were no vitals taken for this visit.  Physical Exam Constitutional:      Appearance: Normal appearance.  Pulmonary:     Effort: Pulmonary effort is normal.  Skin:    General: Skin is warm and dry.  Neurological:     Mental Status: She is alert.  Psychiatric:        Mood and Affect: Mood normal.     Ortho Exam Examination of her neck she has no pain with forward flexion or extension and turning to the left turning to the right does reproduce her radicular symptoms in her right hand.  Strength is intact.  With regards with 2 her shoulder she has pain  reproduced in the [facility] area with overhead motion.  Pain with internal rotation behind the back she has a positive empty can sign Specialty Comments:  No specialty comments available.  Imaging: XR Shoulder Right Result Date: 05/11/2024 Radiographs of the right shoulder demonstrate some degenerative changes both of the glenohumeral and AC joint.  No acute osseous lesions  XR Cervical Spine 2 or 3 views Result Date: 05/11/2024 2 view radiographs of C-spine demonstrate degenerative changes at C5-C6 with narrowing.  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 No pertinent family history.  Past Surgical History:  Procedure Laterality Date   ABDOMINAL HYSTERECTOMY  08/11/2002   abd hyst-lt ovarian cyst   DIAGNOSTIC LAPAROSCOPY  08/11/2006   bilat SO-Adhesions   DILATION AND CURETTAGE OF UTERUS     ORIF ANKLE FRACTURE  03/05/2012   Procedure: OPEN REDUCTION INTERNAL FIXATION (ORIF) ANKLE FRACTURE;  Surgeon: Fonda SHAUNNA Olmsted, MD;  Location: [facility];  Service: Orthopedics;  Laterality: Right;  OPEN TREATMENT BIMALLEOLAR ANKLE INCLUDES INTERNAL FIXATION   tendon repair in ankle Left 2021   L ankle   Social History   Occupational History   Not on file  Tobacco Use   Smoking status: Never   Smokeless tobacco: Never  Substance and Sexual Activity   Alcohol use: No   Drug use: No   Sexual activity: Not on file

## 2024-05-12 ENCOUNTER — Ambulatory Visit: Admitting: Rehabilitative and Restorative Service Providers"

## 2024-05-12 ENCOUNTER — Encounter: Payer: Self-pay | Admitting: Rehabilitative and Restorative Service Providers"

## 2024-05-12 DIAGNOSIS — M542 Cervicalgia: Secondary | ICD-10-CM

## 2024-05-12 DIAGNOSIS — M6281 Muscle weakness (generalized): Secondary | ICD-10-CM

## 2024-05-12 DIAGNOSIS — R293 Abnormal posture: Secondary | ICD-10-CM | POA: Diagnosis not present

## 2024-05-12 DIAGNOSIS — M25511 Pain in right shoulder: Secondary | ICD-10-CM | POA: Diagnosis not present

## 2024-05-12 NOTE — Therapy (Signed)
 OUTPATIENT PHYSICAL THERAPY EVALUATION   Patient Name: Cynthia Hendricks MRN: [id number] DOB:[birth year], 58 y.o., female Today's Date: 05/12/2024  END OF SESSION:  PT End of Session - 05/12/24 1054     Visit Number 1    Number of Visits 20    Date for Recertification  07/21/24    Authorization Type [employer] $[id number] copay    PT Start Time 1058    PT Stop Time 1135    PT Time Calculation (min) 37 min    Activity Tolerance Patient tolerated treatment well    Behavior During Therapy [facility] for tasks assessed/performed          Past Medical History:  Diagnosis Date   Diabetes mellitus    Fluid retention    Fracture of fibula, distal, right, closed 03/05/2012   Hypertension    Sleep apnea 2006   tested mod -tried cpap-not using-said better   Past Surgical History:  Procedure Laterality Date   ABDOMINAL HYSTERECTOMY  08/11/2002   abd hyst-lt ovarian cyst   DIAGNOSTIC LAPAROSCOPY  08/11/2006   bilat SO-Adhesions   DILATION AND CURETTAGE OF UTERUS     ORIF ANKLE FRACTURE  03/05/2012   Procedure: OPEN REDUCTION INTERNAL FIXATION (ORIF) ANKLE FRACTURE;  Surgeon: Fonda SHAUNNA Olmsted, MD;  Location: [facility];  Service: Orthopedics;  Laterality: Right;  OPEN TREATMENT BIMALLEOLAR ANKLE INCLUDES INTERNAL FIXATION   tendon repair in ankle Left 2021   L ankle   Patient Active Problem List   Diagnosis Date Noted   Nontraumatic rupture of tendons of left foot and ankle 06/20/2019   Atrophy of vagina 09/30/2018   Reduced libido 09/30/2018   Lumbar radiculopathy 09/01/2018   Hypercholesterolemia 08/23/2018   Type 2 diabetes mellitus (HCC) 08/23/2018   Chlamydial infection 06/12/2017   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   Fracture of fibula, distal, right, closed 03/05/2012    PCP: Valma Carwin MD  REFERRING PROVIDER: Persons, Ronal Dragon, PA  REFERRING DIAG: [phone] (ICD-10-CM) - Chronic right shoulder pain M54.2 (ICD-10-CM) - Neck  pain  THERAPY DIAG:  Cervicalgia  Acute pain of right shoulder  Muscle weakness (generalized)  Abnormal posture  Rationale for Evaluation and Treatment: Rehabilitation  ONSET DATE: 05/06/2024  SUBJECTIVE:                                                                                                                                                                                                         SUBJECTIVE STATEMENT: Pt indicated initially having some complaints on Lt arm about 4 months ago  but that improved.  Reported having complaints last Friday having a pulling that led to complaints of Rt shoulder and tingling in Rt arm.  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   Pt indicated also having some complaints of neck and headache as well.   Pt indicated symptoms impact sleeping.   Pt indicated no vision changes.  Reported a little bit of off balance.    PERTINENT HISTORY:  DM, HTN  PAIN:  NPRS scale: at worst 10/10, at current 7/10 Pain location: neck/head, Rt arm Pain description: sharp, shooting, ache Aggravating factors: driving bus, sleeping, sitting prolonged, tight head movements.  Relieving factors: medicine, massaging neck, stretch  PRECAUTIONS: None  RED FLAGS: None  WEIGHT BEARING RESTRICTIONS: No  FALLS:  Has patient fallen in last 6 months? 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   PLOF: Independent.  Cook/baking.   PATIENT GOALS: Reduce pain    OBJECTIVE:   DIAGNOSTIC FINDINGS:  Radiographs of the right shoulder demonstrate some degenerative changes  both of the glenohumeral and AC joint.  No acute osseous lesions   2 view radiographs of C-spine demonstrate degenerative changes at C5-C6  with narrowing.  No listhesis straightening of the normal lordotic curve   PATIENT SURVEYS: Patient-Specific Activity Scoring Scheme  0 represents "unable to  perform." 10 represents "able to perform at prior level. 0 1 2 3 4 5 6 7 8 9  10 (Date and Score)   Activity Eval  05/12/2024    1. Driving bus  3    2. Sleeping normally   3    3. Housework/cooking 6   4.    5.    Score 4 avg    Total score = sum of the activity scores/number of activities Minimum detectable change (90%CI) for average score = 2 points Minimum detectable change (90%CI) for single activity score = 3 points  COGNITION: 05/12/2024 Overall cognitive status: Within functional limits for tasks assessed  SENSATION: 05/12/2024 Not specifically tested.   POSTURE:  05/12/2024 Mild FHP, rounded shoulders.   PALPATION: 05/12/2024 Trigger points with concordant symptoms noted in Rt upper trap, levator, infraspinatus.    CERVICAL ROM:   ROM AROM (deg) Eval 05/12/2024  Flexion 68  Extension 35 with Rt side neck pain No UE symptom change  Right lateral flexion   Left lateral flexion   Right rotation 65 c pain Rt neck  Left rotation 65   (Blank rows = not tested)  UPPER EXTREMITY ROM:   ROM Right Eval 05/12/2024 Left Eval 05/12/2024  Shoulder flexion    Shoulder extension    Shoulder abduction    Shoulder adduction    Shoulder extension    Shoulder internal rotation    Shoulder external rotation    Elbow flexion    Elbow extension                HBB T10 with discomfort T10 with discomfort           (Blank rows = not tested)  UPPER EXTREMITY MMT:  MMT Right Eval 05/12/2024 Left Eval 05/12/2024  Shoulder flexion 4/5 c pain (5/5 post manual) 5/5  Shoulder extension    Shoulder abduction 5/5 5/5  Shoulder adduction    Shoulder extension    Shoulder internal rotation 5/5 5/5  Shoulder external rotation 4/5 c pain( 5/5 post manual) 5/5  Middle trapezius    Lower trapezius  Elbow flexion    Elbow extension    Wrist flexion    Wrist extension    Wrist ulnar deviation    Wrist radial deviation    Wrist pronation    Wrist supination     Grip strength     (Blank rows = not tested)  SPECIAL TESTS:  05/12/2024 (-) Distraction for radicular symptom change.  (+) Spurling's for neck pain but not radicular pain.   FUNCTIONAL TESTS:  05/12/2024 None performend                                                                                                                                                                                  TODAY'S TREATMENT:                                                                                                       DATE: 05/12/2024 Therex:    HEP instruction/performance c cues for techniques, handout provided.  Trial set performed of each for comprehension and symptom assessment.  See below for exercise list  Manual Percussive device to Rt upper trap, infraspinatus for trigger point release.  Discussed option for purchase at home.   Attempted supine cervical distraction but did not provide any relief.   PATIENT EDUCATION:  05/12/2024 Education details: HEP, POC Person educated: Patient Education method: Programmer, multimedia, Demonstration, Verbal cues, and Handouts Education comprehension: verbalized understanding, returned demonstration, and verbal cues required  HOME EXERCISE PROGRAM: Access Code: [email or web address] URL: [email or web address] Date: 05/12/2024 Prepared by: Ozell Silvan  Exercises - Seated Scapular Retraction  - 2-3 x daily - 7 x weekly - 1 sets - 10 reps - 3-5 hold - Seated Upper Trapezius Stretch  - 2-3 x daily - 7 x weekly - 1 sets - 5 reps - 15 hold - Standing Shoulder Posterior Capsule Stretch (Mirrored)  - 2-3 x daily - 7 x weekly - 1 sets - 5 reps - 15-30 hold - Cervical Retraction at Wall  - 2-3 x daily - 7 x weekly - 1 sets - 10 reps - 5 hold  ASSESSMENT:  CLINICAL IMPRESSION: Patient is a 58 y.o. who comes to clinic with complaints of neck pain, Rt arm pain/symptoms with mobility, strength and movement coordination deficits that impair their  ability to  perform usual daily and recreational functional activities without increase difficulty/symptoms at this time.  Patient to benefit from skilled PT services to address impairments and limitations to improve to previous level of function without restriction secondary to condition.    OBJECTIVE IMPAIRMENTS: decreased activity tolerance, decreased coordination, decreased endurance, decreased mobility, decreased ROM, decreased strength, hypomobility, increased fascial restrictions, impaired perceived functional ability, increased muscle spasms, impaired flexibility, impaired UE functional use, improper body mechanics, postural dysfunction, and pain.   ACTIVITY LIMITATIONS: carrying, lifting, sitting, sleeping, and reach over head  PARTICIPATION LIMITATIONS: meal prep, cleaning, laundry, interpersonal relationship, driving, community activity, and occupation  PERSONAL FACTORS: unremarkable are also affecting patient's functional outcome.   REHAB POTENTIAL: Good  CLINICAL DECISION MAKING: Stable/uncomplicated  EVALUATION COMPLEXITY: Low   GOALS: Goals reviewed with patient? Yes  SHORT TERM GOALS: (target date for Short term goals are 3 weeks 06/02/2024)  1.Patient will demonstrate independent use of home exercise program to maintain progress from in clinic treatments. Goal status: New  LONG TERM GOALS: (target dates for all long term goals are 10 weeks  07/21/2024 )   1. Patient will demonstrate/report pain at worst less than or equal to 2/10 to facilitate minimal limitation in daily activity secondary to pain symptoms. Goal status: New   2. Patient will demonstrate independent use of home exercise program to facilitate ability to maintain/progress functional gains from skilled physical therapy services. Goal status: New   3. Patient will demonstrate Patient specific functional scale avg > or = 8/10 to indicate reduced disability due to condition.  Goal status: New   4.  Patient will  demonstrate cervical AROM WFL s symptoms to facilitate usual head movements for daily activity including driving, self care.   Goal status: New   5.  Patient will demonstrate Rt UE MMT 5/5 throughout to facilitate usual lifting, carrying and bus driving at [facility].   Goal status: New     PLAN:  PT FREQUENCY: 1-2x/week  PT DURATION: 10 weeks  Can include 02853- PT Re-evaluation, 97110-Therapeutic exercises, 97530- Therapeutic activity, [id number]- Neuromuscular re-education, 97535- Self Care, 97140- Manual therapy, [phone]- Gait training, [id number]- Orthotic Fit/training, [id number]- Canalith repositioning, [id number]- Aquatic Therapy, [phone]- Electrical stimulation (unattended), [id number] Physical performance testing, 97016- Vasopneumatic device, [id number]- Ultrasound, [id number]- Traction (mechanical), [id number]- Ionotophoresis 4mg /ml Dexamethasone ,  79439 - Needle insertion w/o injection 1 or 2 muscles, 20561 - Needle insertion w/o injection 3 or more muscles.   Patient/Family education, Balance training, Stair training, Taping, Dry Needling, Joint mobilization, Joint manipulation, Spinal manipulation, Spinal mobilization, Scar mobilization, Vestibular training, Visual/preceptual remediation/compensation, DME instructions, Cryotherapy, and Moist heat.  All performed as medically necessary.  All included unless contraindicated  PLAN FOR NEXT SESSION: Check HEP use/response.  Myofascial release techniques for upper trap and infraspinatus.  Briefly discussed DN but not in depth.    Ozell Silvan, PT, DPT, OCS, ATC 05/12/24  11:40 AM

## 2024-05-18 ENCOUNTER — Other Ambulatory Visit: Payer: Self-pay | Admitting: Orthopedic Surgery

## 2024-05-18 DIAGNOSIS — M542 Cervicalgia: Secondary | ICD-10-CM

## 2024-05-23 ENCOUNTER — Encounter: Payer: Self-pay | Admitting: Orthopedic Surgery

## 2024-05-26 ENCOUNTER — Other Ambulatory Visit: Payer: Self-pay | Admitting: Physician Assistant

## 2024-05-27 ENCOUNTER — Ambulatory Visit
Admission: RE | Admit: 2024-05-27 | Discharge: 2024-05-27 | Disposition: A | Discharge status: Home / Self Care | Source: Ambulatory Visit | Attending: Orthopedic Surgery | Admitting: Orthopedic Surgery

## 2024-05-27 DIAGNOSIS — M542 Cervicalgia: Secondary | ICD-10-CM

## 2024-05-27 MED ORDER — GADOPICLENOL 0.5 MMOL/ML IV SOLN
10.0000 mL | Freq: Once | INTRAVENOUS | Status: AC | PRN
  Administered 2024-05-27: 9 mL via INTRAVENOUS

## 2024-05-30 ENCOUNTER — Encounter: Admitting: Rehabilitative and Restorative Service Providers"

## 2024-05-30 NOTE — Therapy (Incomplete)
 OUTPATIENT PHYSICAL THERAPY TREATMENT   Patient Name: Cynthia Hendricks MRN: [id number] DOB:1965-12-04, 58 y.o., female Today's Date: 05/30/2024  END OF SESSION:    Past Medical History:  Diagnosis Date   Diabetes mellitus    Fluid retention    Fracture of fibula, distal, right, closed 03/05/2012   Hypertension    Sleep apnea 2006   tested mod -tried cpap-not using-said better   Past Surgical History:  Procedure Laterality Date   ABDOMINAL HYSTERECTOMY  08/11/2002   abd hyst-lt ovarian cyst   DIAGNOSTIC LAPAROSCOPY  08/11/2006   bilat SO-Adhesions   DILATION AND CURETTAGE OF UTERUS     ORIF ANKLE FRACTURE  03/05/2012   Procedure: OPEN REDUCTION INTERNAL FIXATION (ORIF) ANKLE FRACTURE;  Surgeon: Fonda SHAUNNA Olmsted, MD;  Location: [facility];  Service: Orthopedics;  Laterality: Right;  OPEN TREATMENT BIMALLEOLAR ANKLE INCLUDES INTERNAL FIXATION   tendon repair in ankle Left 2021   L ankle   Patient Active Problem List   Diagnosis Date Noted   Nontraumatic rupture of tendons of left foot and ankle 06/20/2019   Atrophy of vagina 09/30/2018   Reduced libido 09/30/2018   Lumbar radiculopathy 09/01/2018   Hypercholesterolemia 08/23/2018   Type 2 diabetes mellitus (HCC) 08/23/2018   Chlamydial infection 06/12/2017   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  Rationale for Evaluation and Treatment: Rehabilitation  ONSET DATE: 05/06/2024  SUBJECTIVE:                                                                                                                                                                                                         SUBJECTIVE STATEMENT: Pt  indicated initially having some complaints on Lt arm about 4 months ago but that improved.  Reported having complaints last Friday having a pulling that led to complaints of Rt shoulder and tingling in Rt arm.  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   Pt indicated also having some complaints of neck and headache as well.   Pt indicated symptoms impact sleeping.   Pt indicated no vision changes.  Reported a little bit of off balance.    PERTINENT HISTORY:  DM, HTN  PAIN:  NPRS scale: at worst 10/10, at current 7/10 Pain location: neck/head, Rt arm Pain description: sharp, shooting,  ache Aggravating factors: driving bus, sleeping, sitting prolonged, tight head movements.  Relieving factors: medicine, massaging neck, stretch  PRECAUTIONS: None  RED FLAGS: None  WEIGHT BEARING RESTRICTIONS: No  FALLS:  Has patient fallen in last 6 months? 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   PLOF: Independent.  Cook/baking.   PATIENT GOALS: Reduce pain    OBJECTIVE:   DIAGNOSTIC FINDINGS:  Radiographs of the right shoulder demonstrate some degenerative changes  both of the glenohumeral and AC joint.  No acute osseous lesions   2 view radiographs of C-spine demonstrate degenerative changes at C5-C6  with narrowing.  No listhesis straightening of the normal lordotic curve   PATIENT SURVEYS: Patient-Specific Activity Scoring Scheme  0 represents "unable to perform." 10 represents "able to perform at prior level. 0 1 2 3 4 5 6 7 8 9  10 (Date and Score)   Activity Eval  05/12/2024    1. Driving bus  3    2. Sleeping normally   3    3. Housework/cooking 6   4.    5.    Score 4 avg    Total score = sum of the activity scores/number of activities Minimum detectable change (90%CI) for average score = 2 points Minimum detectable change (90%CI) for single activity score = 3  points  COGNITION: 05/12/2024 Overall cognitive status: Within functional limits for tasks assessed  SENSATION: 05/12/2024 Not specifically tested.   POSTURE:  05/12/2024 Mild FHP, rounded shoulders.   PALPATION: 05/12/2024 Trigger points with concordant symptoms noted in Rt upper trap, levator, infraspinatus.    CERVICAL ROM:   ROM AROM (deg) Eval 05/12/2024  Flexion 68  Extension 35 with Rt side neck pain No UE symptom change  Right lateral flexion   Left lateral flexion   Right rotation 65 c pain Rt neck  Left rotation 65   (Blank rows = not tested)  UPPER EXTREMITY ROM:   ROM Right Eval 05/12/2024 Left Eval 05/12/2024  Shoulder flexion    Shoulder extension    Shoulder abduction    Shoulder adduction    Shoulder extension    Shoulder internal rotation    Shoulder external rotation    Elbow flexion    Elbow extension                HBB T10 with discomfort T10 with discomfort           (Blank rows = not tested)  UPPER EXTREMITY MMT:  MMT Right Eval 05/12/2024 Left Eval 05/12/2024  Shoulder flexion 4/5 c pain (5/5 post manual) 5/5  Shoulder extension    Shoulder abduction 5/5 5/5  Shoulder adduction    Shoulder extension    Shoulder internal rotation 5/5 5/5  Shoulder external rotation 4/5 c pain( 5/5 post manual) 5/5  Middle trapezius    Lower trapezius    Elbow flexion    Elbow extension    Wrist flexion    Wrist extension    Wrist ulnar deviation    Wrist radial deviation    Wrist pronation    Wrist supination    Grip strength     (Blank rows = not tested)  SPECIAL TESTS:  05/12/2024 (-) Distraction for radicular symptom change.  (+) Spurling's for neck pain but not radicular pain.   FUNCTIONAL TESTS:  05/12/2024 None performend  TODAY'S TREATMENT:                                                                                                        DATE: 05/30/2024 Therex:   Manual   TODAY'S TREATMENT:                                                                                                       DATE: 05/12/2024 Therex:    HEP instruction/performance c cues for techniques, handout provided.  Trial set performed of each for comprehension and symptom assessment.  See below for exercise list  Manual Percussive device to Rt upper trap, infraspinatus for trigger point release.  Discussed option for purchase at home.   Attempted supine cervical distraction but did not provide any relief.   PATIENT EDUCATION:  05/12/2024 Education details: HEP, POC Person educated: Patient Education method: Programmer, multimedia, Demonstration, Verbal cues, and Handouts Education comprehension: verbalized understanding, returned demonstration, and verbal cues required  HOME EXERCISE PROGRAM: Access Code: [email or web address] URL: [email or web address] Date: 05/12/2024 Prepared by: Ozell Silvan  Exercises - Seated Scapular Retraction  - 2-3 x daily - 7 x weekly - 1 sets - 10 reps - 3-5 hold - Seated Upper Trapezius Stretch  - 2-3 x daily - 7 x weekly - 1 sets - 5 reps - 15 hold - Standing Shoulder Posterior Capsule Stretch (Mirrored)  - 2-3 x daily - 7 x weekly - 1 sets - 5 reps - 15-30 hold - Cervical Retraction at Wall  - 2-3 x daily - 7 x weekly - 1 sets - 10 reps - 5 hold  ASSESSMENT:  CLINICAL IMPRESSION: Patient is a 58 y.o. who comes to clinic with complaints of neck pain, Rt arm pain/symptoms with mobility, strength and movement coordination deficits that impair their ability to perform usual daily and recreational functional activities without increase difficulty/symptoms at this time.  Patient to benefit from skilled PT services to address impairments and limitations to improve to previous level of function without restriction secondary to condition.     OBJECTIVE IMPAIRMENTS: decreased activity tolerance, decreased coordination, decreased endurance, decreased mobility, decreased ROM, decreased strength, hypomobility, increased fascial restrictions, impaired perceived functional ability, increased muscle spasms, impaired flexibility, impaired UE functional use, improper body mechanics, postural dysfunction, and pain.   ACTIVITY LIMITATIONS: carrying, lifting, sitting, sleeping, and reach over head  PARTICIPATION LIMITATIONS: meal prep, cleaning, laundry, interpersonal relationship, driving, community activity, and occupation  PERSONAL FACTORS: unremarkable are also affecting patient's functional outcome.   REHAB POTENTIAL: Good  CLINICAL DECISION MAKING: Stable/uncomplicated  EVALUATION COMPLEXITY: Low   GOALS: Goals reviewed with patient? Yes  SHORT TERM GOALS: (target date  for Short term goals are 3 weeks 06/02/2024)  1.Patient will demonstrate independent use of home exercise program to maintain progress from in clinic treatments. Goal status: New  LONG TERM GOALS: (target dates for all long term goals are 10 weeks  07/21/2024 )   1. Patient will demonstrate/report pain at worst less than or equal to 2/10 to facilitate minimal limitation in daily activity secondary to pain symptoms. Goal status: New   2. Patient will demonstrate independent use of home exercise program to facilitate ability to maintain/progress functional gains from skilled physical therapy services. Goal status: New   3. Patient will demonstrate Patient specific functional scale avg > or = 8/10 to indicate reduced disability due to condition.  Goal status: New   4.  Patient will demonstrate cervical AROM WFL s symptoms to facilitate usual head movements for daily activity including driving, self care.   Goal status: New   5.  Patient will demonstrate Rt UE MMT 5/5 throughout to facilitate usual lifting, carrying and bus driving at [facility].   Goal status:  New     PLAN:  PT FREQUENCY: 1-2x/week  PT DURATION: 10 weeks  Can include 02853- PT Re-evaluation, 97110-Therapeutic exercises, 97530- Therapeutic activity, [id number]- Neuromuscular re-education, 97535- Self Care, 97140- Manual therapy, [phone]- Gait training, [phone]- Orthotic Fit/training, [phone]- Canalith repositioning, [id number]- Aquatic Therapy, [phone]- Electrical stimulation (unattended), [id number] Physical performance testing, 97016- Vasopneumatic device, [id number]- Ultrasound, [id number]- Traction (mechanical), [id number]- Ionotophoresis 4mg /ml Dexamethasone ,  79439 - Needle insertion w/o injection 1 or 2 muscles, 20561 - Needle insertion w/o injection 3 or more muscles.   Patient/Family education, Balance training, Stair training, Taping, Dry Needling, Joint mobilization, Joint manipulation, Spinal manipulation, Spinal mobilization, Scar mobilization, Vestibular training, Visual/preceptual remediation/compensation, DME instructions, Cryotherapy, and Moist heat.  All performed as medically necessary.  All included unless contraindicated  PLAN FOR NEXT SESSION: Check HEP use/response.  Myofascial release techniques for upper trap and infraspinatus.  Briefly discussed DN but not in depth.    Ozell Silvan, PT, DPT, OCS, ATC 05/30/24  7:57 AM

## 2024-06-13 ENCOUNTER — Encounter: Payer: Self-pay | Admitting: Radiology

## 2024-06-18 ENCOUNTER — Emergency Department (HOSPITAL_BASED_OUTPATIENT_CLINIC_OR_DEPARTMENT_OTHER)
Admission: EM | Admit: 2024-06-18 | Discharge: 2024-06-18 | Disposition: A | Discharge status: Home / Self Care | Attending: Emergency Medicine | Admitting: Emergency Medicine

## 2024-06-18 ENCOUNTER — Encounter (HOSPITAL_BASED_OUTPATIENT_CLINIC_OR_DEPARTMENT_OTHER): Payer: Self-pay | Admitting: Emergency Medicine

## 2024-06-18 ENCOUNTER — Other Ambulatory Visit: Payer: Self-pay

## 2024-06-18 ENCOUNTER — Emergency Department (HOSPITAL_BASED_OUTPATIENT_CLINIC_OR_DEPARTMENT_OTHER)

## 2024-06-18 DIAGNOSIS — R918 Other nonspecific abnormal finding of lung field: Secondary | ICD-10-CM | POA: Diagnosis not present

## 2024-06-18 DIAGNOSIS — Y92410 Unspecified street and highway as the place of occurrence of the external cause: Secondary | ICD-10-CM | POA: Insufficient documentation

## 2024-06-18 DIAGNOSIS — S199XXA Unspecified injury of neck, initial encounter: Secondary | ICD-10-CM | POA: Diagnosis present

## 2024-06-18 DIAGNOSIS — S139XXA Sprain of joints and ligaments of unspecified parts of neck, initial encounter: Secondary | ICD-10-CM | POA: Insufficient documentation

## 2024-06-18 MED ORDER — IBUPROFEN 600 MG PO TABS: 600.0000 mg | ORAL_TABLET | Freq: Four times a day (QID) | ORAL | 0 refills | Status: AC | PRN

## 2024-06-18 MED ORDER — CYCLOBENZAPRINE HCL 10 MG PO TABS: 10.0000 mg | ORAL_TABLET | Freq: Two times a day (BID) | ORAL | 0 refills | Status: AC | PRN
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
## 2024-07-12 ENCOUNTER — Ambulatory Visit: Attending: Cardiology | Admitting: Cardiology

## 2024-07-12 ENCOUNTER — Encounter: Payer: Self-pay | Admitting: Cardiology

## 2024-07-12 VITALS — BP 142/82 | HR 80 | Resp 16 | Ht 67.0 in | Wt 201.0 lb

## 2024-07-12 DIAGNOSIS — I10 Essential (primary) hypertension: Secondary | ICD-10-CM

## 2024-07-12 DIAGNOSIS — R072 Precordial pain: Secondary | ICD-10-CM | POA: Diagnosis not present

## 2024-07-12 DIAGNOSIS — E119 Type 2 diabetes mellitus without complications: Secondary | ICD-10-CM

## 2024-07-12 DIAGNOSIS — E782 Mixed hyperlipidemia: Secondary | ICD-10-CM

## 2024-07-12 MED ORDER — METOPROLOL TARTRATE 100 MG PO TABS: 100.0000 mg | ORAL_TABLET | Freq: Once | ORAL | 0 refills | Status: DC

## 2024-07-12 NOTE — Patient Instructions (Signed)
 Medication Instructions:  See below   *If you need a refill on your cardiac medications before your next appointment, please call your pharmacy*   Lab Work: BMP  If you have labs (blood work) drawn today and your tests are completely normal, you will receive your results only by: MyChart Message (if you have MyChart) OR A paper copy in the mail If you have any lab test that is abnormal or we need to change your treatment, we will call you to review the results.   Testing/Procedures: 1) Your physician has requested that you have coronary  CTA. Coronary computed tomography (CT)angiogram  is a special type of CT scan that uses a computer to produce multi-dimensional views of major blood vessels throughout the heart.  CT angiography, a contrast material is injected through an IV to help visualize the blood vessels  a painless test that uses an x-ray machine to take clear, detailed pictures of your heart arteries .  Please follow instruction sheet as given.    2) Your physician has requested that you have an echocardiogram. Echocardiography is a painless test that uses sound waves to create images of your heart. It provides your doctor with information about the size and shape of your heart and how well your heart's chambers and valves are working. This procedure takes approximately one hour. There are no restrictions for this procedure. Please do NOT wear cologne, perfume, aftershave, or lotions (deodorant is allowed). Please arrive 15 minutes prior to your appointment time.  Please note: We ask at that you not bring children with you during ultrasound (echo/ vascular) testing. Due to room size and safety concerns, children are not allowed in the ultrasound rooms during exams. Our front office staff cannot provide observation of children in our lobby area while testing is being conducted. An adult accompanying a patient to their appointment will only be allowed in the ultrasound room at the  discretion of the ultrasound technician under special circumstances. We apologize for any inconvenience.   Follow-Up: At [facility], you and your health needs are our priority.  As part of our continuing mission to provide you with exceptional heart care, we have created designated Provider Care Teams.  These Care Teams include your primary Cardiologist (physician) and Advanced Practice Providers (APPs -  Physician Assistants and Nurse Practitioners) who all work together to provide you with the care you need, when you need it.     Your next appointment:   8 week(s)  The format for your next appointment:   In Person  Provider:   Madonna Large, DO   Other Instructions     Your cardiac CT will be scheduled at  the below locations:   [facility] [address]  [place], [place] [address]    If scheduled at the [facility] and V[facility] at [address], please enter the parking lot using the [address] entrance and use the FREE valet service at the patient drop-off area. Enter the building and check-in with registration on the main floor.   Please follow these instructions carefully (unless otherwise directed):  An IV will be required for this test and Nitroglycerin will be given.    On the Night Before the Test: Be sure to Drink plenty of water. Do not consume any caffeinated/decaffeinated beverages or chocolate 12 hours prior to your test. Do not take any antihistamines 12 hours prior to your test.   On the Day of the Test: Drink plenty  of water until 1 hour prior to the test. Do not eat any food 1 hour prior to test. You may take your regular medications prior to the test.  Take metoprolol (Lopressor)  100 mg two hours prior to test. Patients who wear a continuous glucose monitor MUST remove the device prior to scanning. FEMALES- please wear underwire-free bra if available, avoid dresses & tight clothing    After the  Test: Drink plenty of water. After receiving IV contrast, you may experience a mild flushed feeling. This is normal. On occasion, you may experience a mild rash up to 24 hours after the test. This is not dangerous. If this occurs, you can take Benadryl 25 mg, Zyrtec, Claritin, or Allegra and increase your fluid intake. (Patients taking Tikosyn should avoid Benadryl, and may take Zyrtec, Claritin, or Allegra) If you experience trouble breathing, this can be serious. If it is severe call 911 IMMEDIATELY. If it is mild, please call our office.  We will call to schedule your test 2-4 weeks out understanding that some insurance companies will need an authorization prior to the service being performed.   For more information and frequently asked questions, please visit our website : [email or web address]  For non-scheduling related questions, please contact the cardiac imaging nurse navigator should you have any questions/concerns: Cardiac Imaging Nurse Navigators Direct Office Dial: [phone]   For scheduling needs, including cancellations and rescheduling, please call Brittany, [phone].

## 2024-07-12 NOTE — Progress Notes (Signed)
 Cardiology Office Note:    Date:  07/12/2024  NAME:  Cynthia Hendricks    MRN: [id number] DOB:  1965-10-22   PCP:  Valma Carwin, MD  Former Cardiology Providers: None Primary Cardiologist:  Madonna Large, DO, [facility] (established care 07/12/2024) Electrophysiologist:  None   Referring MD: Valma Carwin, MD  Reason of Consult: Cardiovascular risk factors  Chief Complaint  Patient presents with   New Patient (Initial Visit)    Cardiac risk reduction    Chest Pain    History of Present Illness:    Cynthia Hendricks is a 58 y.o. African-American female whose past medical history and cardiovascular risk factors includes: Hypertension, GERD, history of carpal tunnel release right sided, diabetes mellitus type 2, hx of carcinoma in situ of vulva.  She is being seen today for the evaluation of cardiovascular risk reduction/risk stratification and chest pain at the request of Valma Carwin, MD.  Patient had a smart vascular diagnosis screening and her score was 10.53% per report this implies she is at 4.46 times (relative risk) more likely to experience ACS then would be expected for her age and sex. This was provided by the PCP as part of the referral notes. However, patient does not having his performed.  ROS is positive for CP.   Chest Pain Onset: around September 2025 Last occurrence: couple weeks ago Location: substernal pain, radiates to the left arm Intensity: 5/10 Frequency: randomly  Duration: minutes Improving factors: none Worsening factors: none Do symptoms worsen with effort related activities: No Do symptoms improve with resting: No Are symptoms self limited: Yes Are symptoms positional / pleuritic: no Risk Factors: NIDDM and Obesity   Current Medications: Current Meds  Medication Sig   cyclobenzaprine  (FLEXERIL ) 10 MG tablet Take 1 tablet (10 mg total) by mouth 2 (two) times daily as needed for muscle spasms.   ibuprofen  (ADVIL ) 600 MG tablet Take 1 tablet  (600 mg total) by mouth every 6 (six) hours as needed.   metFORMIN (GLUCOPHAGE) 500 MG tablet Take 500 mg by mouth daily with breakfast.   metoprolol tartrate (LOPRESSOR) 100 MG tablet Take 1 tablet (100 mg total) by mouth once for 1 dose. TAKE TWO HOURS PRIOR TO  SCHEDULE CARDIAC TEST   Multiple Vitamin (MULTI-VITAMIN) tablet Take 1 tablet by mouth daily.   rosuvastatin (CRESTOR) 5 MG tablet Take 5 mg by mouth daily.   tirzepatide (MOUNJARO) 2.5 MG/0.5ML Pen Inject 2.5 mg into the skin once a week.     Allergies:    Patient has no known allergies.   Past Medical History: Past Medical History:  Diagnosis Date   Diabetes mellitus    Fluid retention    Fracture of fibula, distal, right, closed 03/05/2012   Hypertension    Sleep apnea 2006   tested mod -tried cpap-not using-said better    Past Surgical History: Past Surgical History:  Procedure Laterality Date   ABDOMINAL HYSTERECTOMY  08/11/2002   abd hyst-lt ovarian cyst   DIAGNOSTIC LAPAROSCOPY  08/11/2006   bilat SO-Adhesions   DILATION AND CURETTAGE OF UTERUS     ORIF ANKLE FRACTURE  03/05/2012   Procedure: OPEN REDUCTION INTERNAL FIXATION (ORIF) ANKLE FRACTURE;  Surgeon: Fonda SHAUNNA Olmsted, MD;  Location: [facility];  Service: Orthopedics;  Laterality: Right;  OPEN TREATMENT BIMALLEOLAR ANKLE INCLUDES INTERNAL FIXATION   tendon repair in ankle Left 2021   L ankle    Social History: Social History   Tobacco Use   Smoking status:  Never   Smokeless tobacco: Never  Substance Use Topics   Alcohol use: No   Drug use: No    Family History: History reviewed. No pertinent family history.  ROS:   Review of Systems  Cardiovascular:  Positive for chest pain (When present radiates to the left arm). Negative for claudication, irregular heartbeat, leg swelling, near-syncope, orthopnea, palpitations, paroxysmal nocturnal dyspnea and syncope.  Respiratory:  Negative for shortness of breath.   Hematologic/Lymphatic:  Negative for bleeding problem.  Neurological:  Positive for paresthesias (Up right arm).    EKGs/Labs/Other Studies Reviewed:   EKG:  EKG Interpretation Date/Time:  Tuesday July 12 2024 15:00:58 EST Ventricular Rate:  81 PR Interval:  144 QRS Duration:  74 QT Interval:  372 QTC Calculation: 432 R Axis:   67  Text Interpretation: Sinus rhythm with occasional Premature ventricular complexes T wave abnormality, consider inferior ischemia When compared with ECG of 24-Oct-2005 10:07, Premature ventricular complexes are now Present Nonspecific T wave abnormality now evident in Anterior leads Confirmed by Michele Richardson [phone]) on 07/12/2024 3:48:34 PM  Echocardiogram: Will order  Coronary CTA: Will order   Labs:    Latest Ref Rng & Units 03/05/2012    7:09 AM 01/21/2007   10:05 AM  CBC  WBC   7.9   Hemoglobin 12.0 - 15.0 g/dL 85.1  85.3   Hematocrit   43.8   Platelets   297        Latest Ref Rng & Units 03/04/2012   12:50 PM 01/21/2007   10:05 AM  BMP  Glucose 70 - 99 mg/dL 882  859   BUN 6 - 23 mg/dL 17  7   Creatinine 9.49 - 1.10 mg/dL 9.22  9.18   Sodium 864 - 145 mEq/L 139  135   Potassium 3.5 - 5.1 mEq/L 4.3  3.1   Chloride 96 - 112 mEq/L 100  100   CO2 19 - 32 mEq/L 24  27   Calcium 8.4 - 10.5 mg/dL 89.6  8.7       Latest Ref Rng & Units 03/04/2012   12:50 PM 01/21/2007   10:05 AM  CMP  Glucose 70 - 99 mg/dL 882  859   BUN 6 - 23 mg/dL 17  7   Creatinine 9.49 - 1.10 mg/dL 9.22  9.18   Sodium 864 - 145 mEq/L 139  135   Potassium 3.5 - 5.1 mEq/L 4.3  3.1   Chloride 96 - 112 mEq/L 100  100   CO2 19 - 32 mEq/L 24  27   Calcium 8.4 - 10.5 mg/dL 89.6  8.7     No results found for: CHOL, HDL, LDLCALC, LDLDIRECT, TRIG, CHOLHDL No results for input(s): LIPOA in the last 8760 hours. No components found for: NTPROBNP No results for input(s): PROBNP in the last 8760 hours. No results for input(s): TSH in the last 8760 hours.  Physical Exam:     Today's Vitals   07/12/24 1459 07/12/24 1607  BP: (!) 153/97 (!) 142/82  Pulse: 77 80  Resp: 16   SpO2: 98%   Weight: 201 lb (91.2 kg)   Height: 5' 7 (1.702 m)    Body mass index is 31.48 kg/m. Wt Readings from Last 3 Encounters:  07/12/24 201 lb (91.2 kg)  03/03/12 214 lb (97.1 kg)  02/27/12 214 lb (97.1 kg)    Physical Exam  Constitutional: No distress.  hemodynamically stable  Neck: No JVD present.  Cardiovascular: Normal rate, regular rhythm, S1  normal and S2 normal. Exam reveals no gallop, no S3 and no S4.  No murmur heard. Pulmonary/Chest: Effort normal and breath sounds normal. No stridor. She has no wheezes. She has no rales.  Musculoskeletal:        General: No edema.     Cervical back: Neck supple.  Skin: Skin is warm.     Impression & Recommendation(s):  Impression:   ICD-10-CM   1. Precordial pain  R07.2 EKG 12-Lead    Basic metabolic panel with GFR    metoprolol tartrate (LOPRESSOR) 100 MG tablet    CT CORONARY MORPH W/CTA COR W/SCORE W/CA W/CM &/OR WO/CM    ECHOCARDIOGRAM COMPLETE    2. Mixed hyperlipidemia  E78.2     3. Non-insulin dependent type 2 diabetes mellitus (HCC)  E11.9     4. Benign hypertension  I10        Recommendation(s):  Precordial pain Chest pain predominantly noncardiac. Had a screening test in the recent past likely with PCP which noted her to be at elevated risk for cardiovascular comorbidities and therefore referred to cardiology for further evaluation and management EKG today illustrates sinus rhythm with T wave abnormalities, cannot rule out inferior ischemia No active chest pain. Echo will be ordered to evaluate for structural heart disease and left ventricular systolic function. Coronary CTA to evaluate for CAC, plaque burden, and obstructive disease Metoprolol tartrate 100 mg x 1 2 hours prior to her CTA BMP to check renal function  Mixed hyperlipidemia Lipids are currently managed by PCP. No recent labs  available for review. Patient states that she is on Crestor 5 mg p.o. daily, PCP notes mentions she is on 10 mg p.o. daily.  I have deferred her back to PCP to reconcile her medications appropriately.  Non-insulin dependent type 2 diabetes mellitus (HCC) Currently on metformin, Crestor, Mounjaro Recommend ACE inhibitors or ARB given her blood pressures and also for renal protection. Consider SGLT2 inhibitors if no significant contraindications Recommended goal SBP <130 mmHg Emphasized the importance of glycemic control Recommended goal LDL <70 mg/dL  Records provided by PCP mention hypertension and sleep apnea as her comorbid conditions.  Patient does not recall being told that she has high blood pressure or sleep apnea. PCP notes she is on hydrochlorothiazide but the current medication list notes no antihypertensive medications.  Her initial blood pressures were high upon recheck be improved but still not at goal.  I have asked her to keep a log of her blood pressures and to review with PCP accordingly.   Orders Placed:  Orders Placed This Encounter  Procedures   CT CORONARY MORPH W/CTA COR W/SCORE W/CA W/CM &/OR WO/CM    Standing Status:   Future    Expiration Date:   07/12/2025    If indicated for the ordered procedure, I authorize the administration of contrast media per Radiology protocol:   Yes    [facility]    Is patient pregnant?:   No   Basic metabolic panel with GFR   EKG 87-Ozji   ECHOCARDIOGRAM COMPLETE    Standing Status:   Future    Expiration Date:   07/12/2025    Where should this test [facility] performed[facility]    Does the patient weigh less than or greater than 250 lbs?:   Patient weighs less than 250 lbs    Perflutren DEFINITY (image enhancing agent) should be administered unless hypersensitivity or allergy exist:  Administer Perflutren    Reason for exam-Echo:   Chest Pain  R07.9     Final Medication List:     Meds ordered this encounter  Medications   metoprolol tartrate (LOPRESSOR) 100 MG tablet    Sig: Take 1 tablet (100 mg total) by mouth once for 1 dose. TAKE TWO HOURS PRIOR TO  SCHEDULE CARDIAC TEST    Dispense:  2 tablet    Refill:  0     Current Outpatient Medications:    cyclobenzaprine  (FLEXERIL ) 10 MG tablet, Take 1 tablet (10 mg total) by mouth 2 (two) times daily as needed for muscle spasms., Disp: 20 tablet, Rfl: 0   ibuprofen  (ADVIL ) 600 MG tablet, Take 1 tablet (600 mg total) by mouth every 6 (six) hours as needed., Disp: 30 tablet, Rfl: 0   metFORMIN (GLUCOPHAGE) 500 MG tablet, Take 500 mg by mouth daily with breakfast., Disp: , Rfl:    metoprolol tartrate (LOPRESSOR) 100 MG tablet, Take 1 tablet (100 mg total) by mouth once for 1 dose. TAKE TWO HOURS PRIOR TO  SCHEDULE CARDIAC TEST, Disp: 2 tablet, Rfl: 0   Multiple Vitamin (MULTI-VITAMIN) tablet, Take 1 tablet by mouth daily., Disp: , Rfl:    rosuvastatin (CRESTOR) 5 MG tablet, Take 5 mg by mouth daily., Disp: , Rfl:    tirzepatide (MOUNJARO) 2.5 MG/0.5ML Pen, Inject 2.5 mg into the skin once a week., Disp: , Rfl:   Consent:   N/A  Disposition:   8-week follow-up sooner if needed  Her questions and concerns were addressed to her satisfaction. She voices understanding of the recommendations provided during this encounter.   As part of today's office visit discussed management of at least 2 chronic conditions as well as workup for chest pain. EKG independently reviewed 07/12/2024 Referral notes independently reviewed as part of today's encounter. Outside cardiovascular screening results from September 2025 reviewed as part of today's visit Prescription drug management. Ordered additional diagnostic workup. Patient education. Encouraged her to reconcile her medications and comorbidities with PCP   Signed, Madonna Large, DO, [facility]  A Division of [facility] [address]  [place], [place] [address]  07/12/2024

## 2024-07-13 LAB — BASIC METABOLIC PANEL WITH GFR
BUN/Creatinine Ratio: 15 (ref 9–23)
BUN: 11 mg/dL (ref 6–24)
CO2: 20 mmol/L (ref 20–29)
Calcium: 9.6 mg/dL (ref 8.7–10.2)
Chloride: 104 mmol/L (ref 96–106)
Creatinine, Ser: 0.74 mg/dL (ref 0.57–1.00)
Glucose: 94 mg/dL (ref 70–99)
Potassium: 4.6 mmol/L (ref 3.5–5.2)
Sodium: 141 mmol/L (ref 134–144)
eGFR: 94 mL/min/1.73 (ref >59)

## 2024-07-19 ENCOUNTER — Ambulatory Visit: Payer: Self-pay | Admitting: Cardiology

## 2024-07-20 NOTE — Telephone Encounter (Signed)
 Letter of results sent to pt

## 2024-08-02 ENCOUNTER — Ambulatory Visit (HOSPITAL_COMMUNITY)
Admission: RE | Admit: 2024-08-02 | Discharge: 2024-08-02 | Disposition: A | Discharge status: Home / Self Care | Source: Ambulatory Visit | Attending: Internal Medicine | Admitting: Internal Medicine

## 2024-08-02 DIAGNOSIS — R072 Precordial pain: Secondary | ICD-10-CM | POA: Insufficient documentation

## 2024-08-02 MED ORDER — NITROGLYCERIN 0.4 MG SL SUBL
0.8000 mg | SUBLINGUAL_TABLET | Freq: Once | SUBLINGUAL | Status: AC
  Administered 2024-08-02: 0.8 mg via SUBLINGUAL

## 2024-08-02 MED ORDER — IOHEXOL 350 MG/ML SOLN
100.0000 mL | Freq: Once | INTRAVENOUS | Status: AC | PRN
  Administered 2024-08-02: 100 mL via INTRAVENOUS

## 2024-08-16 ENCOUNTER — Ambulatory Visit (HOSPITAL_COMMUNITY)
Admission: RE | Admit: 2024-08-16 | Discharge: 2024-08-16 | Disposition: A | Discharge status: Home / Self Care | Source: Ambulatory Visit | Attending: Cardiology | Admitting: Cardiology

## 2024-08-16 DIAGNOSIS — R072 Precordial pain: Secondary | ICD-10-CM | POA: Diagnosis not present

## 2024-08-16 LAB — ECHOCARDIOGRAM COMPLETE
AR max vel: 2.16 cm2
AV Area VTI: 2.27 cm2
AV Area mean vel: 2.21 cm2
AV Mean grad: 3 mmHg
AV Peak grad: 6 mmHg
Ao pk vel: 1.22 m/s
Area-P 1/2: 4.65 cm2
S' Lateral: 2.64 cm

## 2024-09-08 NOTE — Progress Notes (Signed)
" °  Cardiology Office Note:  .   Date:  09/09/2024  ID:  Cynthia Hendricks, DOB August 11, 1966, MRN [id number] PCP: Valma Carwin, MD  [facility] Providers Cardiologist:  Madonna Large, DO {  History of Present Illness: .   Cynthia Hendricks is a 59 y.o. female with history of hypertension, GERD, carpal tunnel release, diabetes type 2, carcinoma in situ of vulva, spondylosis/spinal stenosis followed by Ortho surgery    Social history  Sister and mother with history of MI. No history of smoking, drinking, drugs.     07/2024 patient recently established care with cardiology with complaints of chest pain that had been going on since September 2025.  Reported radiating symptoms to her left arm with radiation down her left arm with random occurrence.  Symptoms overall felt to be noncardiac.  Coronary CTA demonstrated CAC score 14.6.  Minimal plaque in the LAD/LCx less than 25%.  Echocardiogram with preserved biventricular function, no valve disease.  Today patient presents for follow-up to go over results.  She reports that she has had complete resolution of her chest pain.  She feels that this likely was a orthopedic related issue.  She has been seeing Ortho surgery and she has been getting injections/infusions and now no longer having issues.  She is ambulating around 1 mile 3 times a week without any limitations or symptoms.  She is happy with her workup.  ROS: Denies: Chest pain, shortness of breath, orthopnea, peripheral edema, palpitations, syncope, decreased exercise capacity, fatigue, dizziness.   Studies Reviewed: .         Risk Assessment/Calculations:             Physical Exam:   VS:  BP 118/84   Pulse 85   Ht 5' 7 (1.702 m)   Wt 199 lb 9.6 oz (90.5 kg)   SpO2 96%   BMI 31.26 kg/m    Wt Readings from Last 3 Encounters:  09/09/24 199 lb 9.6 oz (90.5 kg)  07/12/24 201 lb (91.2 kg)  03/03/12 214 lb (97.1 kg)    GEN: Well nourished, well developed in no acute  distress NECK: No JVD; No carotid bruits CARDIAC: RRR, no murmurs, rubs, gallops RESPIRATORY:  Clear to auscultation without rales, wheezing or rhonchi  ABDOMEN: Soft, non-tender, non-distended EXTREMITIES:  No edema; No deformity   ASSESSMENT AND PLAN: .    Chest pain Nonobstructive CAD Hyperlipidemia -07/2024 Coronary CTA demonstrated CAC score 14.6.  Minimal plaque in the LAD/LCx less than 25%.  Echocardiogram with preserved biventricular function, no valve disease. Chest pain resolved, possibly MSK related with history of spinal stenosis and spondylosis given symptoms improved with injection therapy.  Reassuring workup as above. Continue rosuvastatin.  She's still unclear whether she is taking 5 or 10 mg.  Defer to PCP. LDL goal less than 70 is reasonable.  Diabetes Do not have updated A1c in the chart.  She reports A1c 5.7% though.  Continue Mounjaro, metformin.  Hypertension Well-controlled now.  Continue losartan 50 mg, we will update our medication list.  PVCs Noted on EKG and physical exam.  Reports no palpitations and unaware.  If she ever becomes aware or concerned about this could start beta-blocker therapy for suppression.  EF normal as well.   Dispo: 1 year follow-up with Dr. Large  Signed, Thom LITTIE Sluder, PA-C  "

## 2024-09-09 ENCOUNTER — Ambulatory Visit: Attending: Cardiology | Admitting: Cardiology

## 2024-09-09 ENCOUNTER — Ambulatory Visit: Admitting: Cardiology

## 2024-09-09 ENCOUNTER — Encounter: Payer: Self-pay | Admitting: Cardiology

## 2024-09-09 VITALS — BP 118/84 | HR 85 | Ht 67.0 in | Wt 199.6 lb

## 2024-09-09 DIAGNOSIS — R072 Precordial pain: Secondary | ICD-10-CM

## 2024-09-09 DIAGNOSIS — I10 Essential (primary) hypertension: Secondary | ICD-10-CM

## 2024-09-09 DIAGNOSIS — E782 Mixed hyperlipidemia: Secondary | ICD-10-CM

## 2024-09-09 DIAGNOSIS — E119 Type 2 diabetes mellitus without complications: Secondary | ICD-10-CM | POA: Diagnosis not present

## 2024-09-09 NOTE — Patient Instructions (Signed)
 Medication Instructions:  Your physician recommends that you continue on your current medications as directed. Please refer to the Current Medication list given to you today.  *If you need a refill on your cardiac medications before your next appointment, please call your pharmacy*  Lab Work: NONE  If you have labs (blood work) drawn today and your tests are completely normal, you will receive your results only by: MyChart Message (if you have MyChart) OR A paper copy in the mail If you have any lab test that is abnormal or we need to change your treatment, we will call you to review the results.  Testing/Procedures: NONE  Follow-Up: At [facility], you and your health needs are our priority.  As part of our continuing mission to provide you with exceptional heart care, our providers are all part of one team.  This team includes your primary Cardiologist (physician) and Advanced Practice Providers or APPs (Physician Assistants and Nurse Practitioners) who all work together to provide you with the care you need, when you need it.  Your next appointment:   1 year(s)  Provider:   Madonna Large, DO
